# Patient Record
Sex: Female | Born: 1955 | Race: White | Hispanic: No | Smoking: Current every day smoker
Health system: Southern US, Community
[De-identification: ages and names within clinical notes are randomized; demographics above are authoritative.]

## PROBLEM LIST (undated history)

## (undated) ENCOUNTER — Emergency Department (HOSPITAL_COMMUNITY): Admission: EM | Payer: Medicare Other | Source: Home / Self Care

## (undated) DIAGNOSIS — F200 Paranoid schizophrenia: Secondary | ICD-10-CM

## (undated) DIAGNOSIS — E079 Disorder of thyroid, unspecified: Secondary | ICD-10-CM

## (undated) HISTORY — PX: ABDOMINAL HYSTERECTOMY: SHX81

---

## 2012-10-19 ENCOUNTER — Encounter (HOSPITAL_COMMUNITY): Payer: Self-pay | Admitting: Emergency Medicine

## 2012-10-19 ENCOUNTER — Emergency Department (HOSPITAL_COMMUNITY)
Admission: EM | Admit: 2012-10-19 | Discharge: 2012-10-21 | Disposition: A | Discharge status: Other Acute Inpatient Hospital | Payer: Medicare Other | Attending: Emergency Medicine | Admitting: Emergency Medicine

## 2012-10-19 DIAGNOSIS — Z91199 Patient's noncompliance with other medical treatment and regimen due to unspecified reason: Secondary | ICD-10-CM | POA: Insufficient documentation

## 2012-10-19 DIAGNOSIS — F200 Paranoid schizophrenia: Secondary | ICD-10-CM | POA: Insufficient documentation

## 2012-10-19 DIAGNOSIS — E079 Disorder of thyroid, unspecified: Secondary | ICD-10-CM | POA: Insufficient documentation

## 2012-10-19 DIAGNOSIS — Z9119 Patient's noncompliance with other medical treatment and regimen: Secondary | ICD-10-CM | POA: Insufficient documentation

## 2012-10-19 DIAGNOSIS — R45851 Suicidal ideations: Secondary | ICD-10-CM | POA: Insufficient documentation

## 2012-10-19 HISTORY — DX: Disorder of thyroid, unspecified: E07.9

## 2012-10-19 HISTORY — DX: Paranoid schizophrenia: F20.0

## 2012-10-19 LAB — COMPREHENSIVE METABOLIC PANEL
ALT: 13 U/L (ref 0–35)
AST: 17 U/L (ref 0–37)
CO2: 24 mEq/L (ref 19–32)
Calcium: 9.1 mg/dL (ref 8.4–10.5)
GFR calc non Af Amer: >90 mL/min (ref >90)
Sodium: 137 mEq/L (ref 135–145)
Total Protein: 6.8 g/dL (ref 6.0–8.3)

## 2012-10-19 LAB — RAPID URINE DRUG SCREEN, HOSP PERFORMED
Amphetamines: NOT DETECTED
Barbiturates: NOT DETECTED
Benzodiazepines: NOT DETECTED
Cocaine: NOT DETECTED
Tetrahydrocannabinol: NOT DETECTED

## 2012-10-19 LAB — CBC
MCH: 30.3 pg (ref 26.0–34.0)
Platelets: 337 10*3/uL (ref 150–400)
RBC: 4.19 MIL/uL (ref 3.87–5.11)
WBC: 7 10*3/uL (ref 4.0–10.5)

## 2012-10-19 MED ORDER — ACETAMINOPHEN 325 MG PO TABS
650.0000 mg | ORAL_TABLET | ORAL | Status: DC | PRN
  Administered 2012-10-20: 650 mg via ORAL
  Filled 2012-10-19: qty 2

## 2012-10-19 MED ORDER — LORAZEPAM 1 MG PO TABS: 1.0000 mg | ORAL_TABLET | Freq: Three times a day (TID) | ORAL | Status: DC | PRN

## 2012-10-19 NOTE — ED Notes (Signed)
Pt states that she is having suicidal thoughts to hang herself.  States she has hung herself before but the rope broke.  Also states that she has cut wrists before.  Pt is overly pleasant in triage, smiling and laughing when talking about suicide.

## 2012-10-19 NOTE — BH Assessment (Signed)
Assessment Note   Dawn Fletcher is an 56 y.o. female who presents to [facility] voluntarily. She states she considered hanging herself today. She endorses SI and states that "someone is stalking me". Pt says she knows someone is stalking her as "my hair has been cut and my coffee will just half disappear". She also states someone broke heel of her shoe. Pt sts she has dx of schizophrenia. She describes mood as "angry, upset, harassed". Her affect doesn't match thought content and affect is euthymic. She says she doesn't know who is stalking her. She also says, "I smelled plastic burning yesterday and I thought it was coming from my skin".  She says she has been inpatient at [facility] in [place]. Pt endorses erratic sleep patterns and says that she has lost 7 lbs b/c she forgets to eat. Thought process is tangential and she displays flight of ideas. Pt denies HI. She denies [facility]. She does say that she sometimes has "blurred vision" but attributes that to her "carpal tunnel". Pt needs inpatient treatment. Dr. Solon Palm telepsych consult rec inpatient also. 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 Thyroid disease   . Paranoid schizophrenia    Axis IV: other psychosocial or environmental problems, problems related to social environment and problems with primary support group Axis V: 31-40 impairment in reality testing  Past Medical History:  Past Medical History  Diagnosis Date  . Thyroid disease   . Paranoid schizophrenia     Past Surgical History  Procedure Date  . Abdominal hysterectomy     Family History: History reviewed. No pertinent family history.  Social History:  reports that she has never smoked. She does not have any smokeless tobacco history on file. She reports that she does not drink alcohol or use illicit  drugs.  Additional Social History:  Alcohol / Drug Use Pain Medications: none Prescriptions: none listed on PTA list Over the Counter: see PTA meds list History of alcohol / drug use?: No history of alcohol / drug abuse  CIWA: CIWA-Ar BP: 102/65 mmHg Pulse Rate: 57  COWS:    Allergies: Not on File  Home Medications:  (Not in a hospital admission)  OB/GYN Status:  No LMP recorded. Patient has had a hysterectomy.  General Assessment Data [facility]: [facility] Living Arrangements: Alone Can pt return to current living arrangement?: Yes Admission Status: Voluntary Is patient capable of signing voluntary admission?: Yes Transfer from: Acute Hospital Referral Source: Self/Family/Friend  Education Status Is patient currently in school?: No Current Grade: na Highest grade of school patient has completed: 31 Name of school: some college  Risk to self Suicidal Ideation: Yes-Currently Present Suicidal Intent: No Is patient at risk for suicide?: Yes Suicidal Plan?: No (pt denies intent but states she considered hanging herself) Access to Means: Yes Specify Access to Suicidal Means: rope What has been your use of drugs/alcohol within the last 12 months?: none Previous Attempts/Gestures: Yes How many times?:  ("a few times") Other Self Harm Risks: pt denies  Triggers for Past Attempts: Unknown Intentional Self Injurious Behavior: None Family Suicide History: No (family hx of schizophrenia) Recent stressful life event(s): Financial Problems (work stress, cost of living) Persecutory voices/beliefs?: Yes Depression: No Depression Symptoms: Feeling angry/irritable Substance abuse history and/or treatment for substance abuse?: No Suicide prevention information given  to non-admitted patients: Not applicable  Risk to Others Homicidal Ideation: No Thoughts of Harm to Others: No Current Homicidal Intent: No Current Homicidal Plan: No Access to Homicidal Means:  No Identified Victim: none History of harm to others?: No Assessment of Violence: None Noted Violent Behavior Description: pt calm during assessment Does patient have access to weapons?: No Criminal Charges Pending?: No Does patient have a court date: No  Psychosis Hallucinations: None noted Delusions: Somatic;Persecutory (someone stalking pt; her skin smelled like plastic burning)  Mental Status Report Appear/Hygiene: Other (Comment) (unremarkable) Eye Contact: Good Motor Activity: Freedom of movement;Restlessness Speech: Tangential;Logical/coherent Level of Consciousness: Alert Mood: Angry;Fearful;Suspicious Affect: Inconsistent with thought content;Other (Comment) (euthymic) Anxiety Level: Moderate Thought Processes: Flight of Ideas;Tangential;Circumstantial;Irrelevant;Coherent;Relevant Judgement: Impaired Orientation: Person;Place;Time;Situation Obsessive Compulsive Thoughts/Behaviors: None  Cognitive Functioning Concentration: Normal Memory: Remote Intact;Recent Intact IQ: Average Insight: Poor Impulse Control: Fair Appetite: Poor Weight Loss: 7  Weight Gain: 0  Sleep: No Change Total Hours of Sleep:  (erratic - sleeps 2 or 7 or no hrs at all depending on night) Vegetative Symptoms: None  ADLScreening [facility] Assessment Services) Patient's cognitive ability adequate to safely complete daily activities?: Yes Patient able to express need for assistance with ADLs?: Yes Independently performs ADLs?: Yes (appropriate for developmental age)  Abuse/Neglect [facility] Physical Abuse: Denies Verbal Abuse: Yes, past (Comment) (no info provided) Sexual Abuse: Denies  Prior Inpatient Therapy Prior Inpatient Therapy: Yes Prior Therapy Dates: pt doesn't remember dates Prior Therapy Facilty/Provider(s): [address], [place] in [place] Reason for Treatment: schizophrenia  Prior Outpatient Therapy Prior Outpatient Therapy: Yes Prior Therapy Dates: pt doesn't know  dates Prior Therapy Facilty/Provider(s): pt doesn't know names of provider(s) Reason for Treatment: schizophrenia  ADL Screening (condition at time of admission) Patient's cognitive ability adequate to safely complete daily activities?: Yes Patient able to express need for assistance with ADLs?: Yes Independently performs ADLs?: Yes (appropriate for developmental age) Weakness of Legs: None Weakness of Arms/Hands: None  Home Assistive Devices/Equipment Home Assistive Devices/Equipment: None    Abuse/Neglect Assessment (Assessment to be complete while patient is alone) Physical Abuse: Denies Verbal Abuse: Yes, past (Comment) (no info provided) Sexual Abuse: Denies Exploitation of patient/patient's resources: Denies Self-Neglect: Denies Values / Beliefs Cultural Requests During Hospitalization: None Spiritual Requests During Hospitalization: None   Advance Directives (For Healthcare) Advance Directive: Patient does not have advance directive    Additional Information 1:1 In Past 12 Months?:  (unknown) CIRT Risk: No Elopement Risk: No Does patient have medical clearance?: Yes     Disposition:  Disposition Disposition of Patient: Inpatient treatment program ([facility] telepysch recommends inpt and writer in agreemtn) Type of inpatient treatment program: Adult  On Site Evaluation by:   Reviewed with Physician:     Donnamarie Rossetti P 10/19/2012 8:13 PM

## 2012-10-19 NOTE — ED Notes (Signed)
Received pt in rm 40, pt laying comfortably in bed, denies any needs at his time. Pt sts she is here for "disturbed thoughts"; pt sts she feels that someone is stalking her and "messing with me. They cut a chunk of my hair, and the heel was broken on my new shoes" Pt then sts "I have paranoid schizophrenia since I was 56 years old, and sometimes my medications need to be adjusted."

## 2012-10-19 NOTE — ED Notes (Signed)
Pt has been scrubbed/wanded.  1 pt belongings bag, 1 polka dot bookbag, one red purse at triage nurses station.

## 2012-10-19 NOTE — ED Notes (Signed)
Pt is on a rant about how she thinks someone is stalking her and cutting her hair, messing with her coffee, hair care products, purse, lipstick, etc.  States that she is suicidal because she thinks someone is going to abduct her.

## 2012-10-19 NOTE — ED Provider Notes (Signed)
History     CSN: [id number]  Arrival date & time 10/19/12  4098   First MD Initiated Contact with Patient 10/19/12 [phone]      Chief Complaint  Patient presents with  . Medical Clearance  . Suicidal    (Consider location/radiation/quality/duration/timing/severity/associated sxs/prior treatment) The history is provided by the patient.  pt with hx schizophrenia. States is not taking her meds, feels others are stalking her, which causes her to feel she wants to kill herself. Feeling worse gradually over a period of a couple weeks. Denies attempt, but states has thoughts of hanging self.  Denies drug or alcohol abuse. States physical health at baseline. Denies headaches, no fever or chills. States eating and drinking normally.       Past Medical History  Diagnosis Date  . Thyroid disease   . Paranoid schizophrenia     Past Surgical History  Procedure Date  . Abdominal hysterectomy     History reviewed. No pertinent family history.  History  Substance Use Topics  . Smoking status: Never Smoker   . Smokeless tobacco: Not on file  . Alcohol Use: No    OB History    Grav Para Term Preterm Abortions TAB SAB Ect Mult Living                  Review of Systems  Constitutional: Negative for fever and chills.  HENT: Negative for neck pain.   Eyes: Negative for redness.  Respiratory: Negative for shortness of breath.   Cardiovascular: Negative for chest pain.  Gastrointestinal: Negative for abdominal pain.  Genitourinary: Negative for flank pain.  Musculoskeletal: Negative for back pain.  Skin: Negative for rash.  Neurological: Negative for headaches.  Hematological: Does not bruise/bleed easily.  Psychiatric/Behavioral: Positive for dysphoric mood.    Allergies  Review of patient's allergies indicates not on file.  Home Medications  No current outpatient prescriptions on file.  BP 145/83  Pulse 67  Temp 98 F (36.7 C) (Oral)  Resp 18  SpO2 98%  Physical  Exam  Nursing note and vitals reviewed. Constitutional: She appears well-developed and well-nourished. No distress.  HENT:  Head: Atraumatic.  Mouth/Throat: Oropharynx is clear and moist.  Eyes: Conjunctivae normal are normal. Pupils are equal, round, and reactive to light. No scleral icterus.  Neck: Neck supple. No tracheal deviation present.  Cardiovascular: Normal rate.   Pulmonary/Chest: Effort normal. No respiratory distress.  Abdominal: Soft. Normal appearance. There is no tenderness.  Musculoskeletal: She exhibits no edema.  Neurological: She is alert.       Motor intact bil. Ambulates w steady gait.   Skin: Skin is warm and dry. No rash noted.  Psychiatric:       Pt w paranoid thoughts. Moves from one unconnected thought process to next. +suicidal thoughts.     ED Course  Procedures (including critical care time)   Labs Reviewed  CBC  COMPREHENSIVE METABOLIC PANEL  ETHANOL  URINE RAPID DRUG SCREEN (HOSP PERFORMED)    Results for orders placed during the hospital encounter of 10/19/12  CBC      Component Value Range   WBC 7.0  4.0 - 10.5 K/uL   RBC 4.19  3.87 - 5.11 MIL/uL   Hemoglobin 12.7  12.0 - 15.0 g/dL   HCT 47.8  29.5 - 62.1 %   MCV 89.5  78.0 - 100.0 fL   MCH 30.3  26.0 - 34.0 pg   MCHC 33.9  30.0 - 36.0 g/dL   RDW  14.6  11.5 - 15.5 %   Platelets 337  150 - 400 K/uL  COMPREHENSIVE METABOLIC PANEL      Component Value Range   Sodium 137  135 - 145 mEq/L   Potassium 4.0  3.5 - 5.1 mEq/L   Chloride 104  96 - 112 mEq/L   CO2 24  19 - 32 mEq/L   Glucose, Bld 118 (*) 70 - 99 mg/dL   BUN 10  6 - 23 mg/dL   Creatinine, Ser 1.61  0.50 - 1.10 mg/dL   Calcium 9.1  8.4 - 09.6 mg/dL   Total Protein 6.8  6.0 - 8.3 g/dL   Albumin 3.5  3.5 - 5.2 g/dL   AST 17  0 - 37 U/L   ALT 13  0 - 35 U/L   Alkaline Phosphatase 97  39 - 117 U/L   Total Bilirubin 0.1 (*) 0.3 - 1.2 mg/dL   GFR calc non Af Amer >90  >90 mL/min   GFR calc Af Amer >90  >90 mL/min  ETHANOL       Component Value Range   Alcohol, Ethyl (B) <11  0 - 11 mg/dL  URINE RAPID DRUG SCREEN (HOSP PERFORMED)      Component Value Range   Opiates NONE DETECTED  NONE DETECTED   Cocaine NONE DETECTED  NONE DETECTED   Benzodiazepines NONE DETECTED  NONE DETECTED   Amphetamines NONE DETECTED  NONE DETECTED   Tetrahydrocannabinol NONE DETECTED  NONE DETECTED   Barbiturates NONE DETECTED  NONE DETECTED       MDM  Labs. Act team and telepsych consult.  Reviewed nursing notes and prior charts for additional history.   Recheck alert, content, awaiting telepsych.         Suzi Roots, MD 10/19/12 [phone]

## 2012-10-19 NOTE — ED Notes (Signed)
Pt belongings moved with pt and placed in locker number 40 outside psych ed

## 2012-10-19 NOTE — ED Notes (Addendum)
Pt belongings:  1 grey scarf, pair of black gloves, pink hat, black hat, blue/black jacket, black jacket, blue wind breaker, red purse, yellow purse, sun glasses, tobacco, misc. clothes, purfume, black pants,  1 polka dot back pack

## 2012-10-20 NOTE — BH Assessment (Signed)
Assessment Note   Dawn Fletcher is an 56 y.o. female that is being reassessed tonight regarding her need for inpatient treatment.  Pt now reports feeling "much better; more rested; and I understand that it was not rational to feel that I am being stalked."  Pt admits hx of Schizophrenia with previous hospitalizations (last one 3 years ago per report) but states that she has been non-compliant with her medication and feels that "when taken correctly, I can work and live alone okay."  Since being given her medications in the ED, pt appears more relaxed and not manic or tangential in speech.  Pt is oriented x3 and is able to understand why she remains in the ED.  Pt no longer voices feeling that she is a threat to herself "I'm not going to do anything."  Pt voices that she is able to contact her current providers if need for enhanced care arises.  Spoke with MD, who agrees to re-evaluate pt and her need for inpatient care.  Awaiting disposition by MD and nursing care staff regarding her disposition.   Dawn Fletcher is an 56 y.o. female who presents to [facility] voluntarily. She states she considered hanging herself today. She endorses SI and states that "someone is stalking me". Pt says she knows someone is stalking her as "my hair has been cut and my coffee will just half disappear". She also states someone broke heel of her shoe. Pt sts she has dx of schizophrenia. She describes mood as "angry, upset, harassed". Her affect doesn't match thought content and affect is euthymic. She says she doesn't know who is stalking her. She also says, "I smelled plastic burning yesterday and I thought it was coming from my skin". She says she has been inpatient at [facility] in [place]. Pt endorses erratic sleep patterns and says that she has lost 7 lbs b/c she forgets to eat. Thought process is tangential and she displays flight of ideas. Pt denies HI. She denies [facility]. She does say that she sometimes has  "blurred vision" but attributes that to her "carpal tunnel". Pt needs inpatient treatment. Dr. Solon Palm telepsych consult rec inpatient also. 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    Axis I: Schizophrenia, Undifferentiated Type Axis II: Deferred Axis III:  Past Medical History  Diagnosis Date  . Thyroid disease   . Paranoid schizophrenia    Axis IV: economic problems and other psychosocial or environmental problems Axis V: 31-40 impairment in reality testing  Past Medical History:  Past Medical History  Diagnosis Date  . Thyroid disease   . Paranoid schizophrenia     Past Surgical History  Procedure Date  . Abdominal hysterectomy     Family History: History reviewed. No pertinent family history.  Social History:  reports that she has never smoked. She does not have any smokeless tobacco history on file. She reports that she does not drink alcohol or use illicit drugs.  Additional Social History:  Alcohol / Drug Use Pain Medications: none Prescriptions: none listed on PTA list Over the Counter: see PTA meds list History of alcohol / drug use?: No history of alcohol / drug abuse  CIWA: CIWA-Ar BP: 117/76 mmHg Pulse Rate: 57  COWS:    Allergies: Not on File  Home Medications:  (Not in a hospital admission)  OB/GYN Status:  No LMP recorded. Patient has had a hysterectomy.  General Assessment Data [facility] Living  Arrangements: Alone Can pt return to current living arrangement?: Yes Admission Status: Voluntary Is patient capable of signing voluntary admission?: Yes Transfer from: Acute Hospital Referral Source: Self/Family/Friend  Education Status Is patient currently in school?: No Current Grade: na Highest grade of school patient has completed: 61 Name of school: some college  Risk to self Suicidal Ideation: No Suicidal Intent: No Is patient at risk for suicide?: Yes Suicidal Plan?: No Access to  Means: Yes Specify Access to Suicidal Means: rope and sharps available when not in ER What has been your use of drugs/alcohol within the last 12 months?: none per pt Previous Attempts/Gestures: Yes How many times?:  (unknown) Other Self Harm Risks: pt denies Triggers for Past Attempts: Unpredictable Intentional Self Injurious Behavior: None Family Suicide History: No Recent stressful life event(s): Turmoil (Comment) (trouble with financial obligations) Persecutory voices/beliefs?: Yes Depression: Yes Depression Symptoms: Guilt Substance abuse history and/or treatment for substance abuse?: No Suicide prevention information given to non-admitted patients: Not applicable  Risk to Others Homicidal Ideation: No Thoughts of Harm to Others: No Current Homicidal Intent: No Current Homicidal Plan: No Access to Homicidal Means: No Identified Victim: none per pt History of harm to others?: No Assessment of Violence: None Noted Violent Behavior Description: pt is resting quietly; able to answer appropriately Does patient have access to weapons?: No Criminal Charges Pending?: No Does patient have a court date: No  Psychosis Hallucinations: None noted Delusions: Persecutory (now admits that her delusions were unrealistic)  Mental Status Report Appear/Hygiene:  (casual in scrubs) Eye Contact: Good Motor Activity: Unremarkable Speech: Logical/coherent;Soft Level of Consciousness: Quiet/awake Mood: Labile Affect: Euphoric Anxiety Level: Minimal Thought Processes: Relevant Judgement: Impaired Orientation: Person;Place;Time;Situation Obsessive Compulsive Thoughts/Behaviors: Moderate  Cognitive Functioning Concentration: Decreased Memory: Remote Impaired;Recent Intact IQ: Average Insight: Fair Impulse Control: Fair Appetite: Fair Weight Loss:  (7 lbs in last several weeks per report; "just haven't had ap) Weight Gain: 0  Sleep: No Change Total Hours of Sleep:  (vascillates;  feels more rested now) Vegetative Symptoms: None  ADLScreening [facility] Assessment Services) Patient's cognitive ability adequate to safely complete daily activities?: Yes Patient able to express need for assistance with ADLs?: Yes Independently performs ADLs?: Yes (appropriate for developmental age)  Abuse/Neglect [facility]) Physical Abuse: Denies Verbal Abuse: Yes, past (Comment) Sexual Abuse: Denies  Prior Inpatient Therapy Prior Inpatient Therapy: Yes Prior Therapy Dates: pt doesn't remember dates Prior Therapy Facilty/Provider(s): [address], [place] in [place] Reason for Treatment: schizophrenia  Prior Outpatient Therapy Prior Outpatient Therapy: Yes Prior Therapy Dates: pt doesn't know dates Prior Therapy Facilty/Provider(s): pt doesn't know names of provider(s) Reason for Treatment: schizophrenia  ADL Screening (condition at time of admission) Patient's cognitive ability adequate to safely complete daily activities?: Yes Patient able to express need for assistance with ADLs?: Yes Independently performs ADLs?: Yes (appropriate for developmental age) Weakness of Legs: None Weakness of Arms/Hands: None  Home Assistive Devices/Equipment Home Assistive Devices/Equipment: None    Abuse/Neglect Assessment (Assessment to be complete while patient is alone) Physical Abuse: Denies Verbal Abuse: Yes, past (Comment) Sexual Abuse: Denies Exploitation of patient/patient's resources: Denies Self-Neglect: Denies Values / Beliefs Cultural Requests During Hospitalization: None Spiritual Requests During Hospitalization: None   Advance Directives (For Healthcare) Advance Directive: Patient does not have advance directive    Additional Information 1:1 In Past 12 Months?: No CIRT Risk: No Elopement Risk: No Does patient have medical clearance?: Yes     Disposition:  Review at [facility] versus discharge with outpatient f/u care.  Awaiting MD  determination. Disposition Disposition of Patient: Referred to Type of inpatient treatment program: Adult Patient referred to: Other (Comment) (may be d/c with referrals to current provider if MD approves)  On Site Evaluation by:   Reviewed with Physician:     Angelica Ran 10/20/2012 9:42 PM

## 2012-10-20 NOTE — ED Notes (Signed)
D: Patient pleasant but appearing superficially bright with fixed smile. Pt denies SI or plans to harm herself. A: Q 15 minute safety checks maintained per protocol. Medications given as ordered by MD. R: Patient with no s/s of distress or inappropriate behaviors.

## 2012-10-20 NOTE — ED Provider Notes (Signed)
No events overnight, awaiting placement. Telepsych recommends inpatient.   Dawn B. Bernette Mayers, MD 10/20/12 [phone]

## 2012-10-20 NOTE — Progress Notes (Signed)
Pt stated to [facility] and nurse she was better and wanted to go home.  Tele Psych may be reordered.  ACT recommendation is to wait until psychiatrist makes rounds on Monday AM.    Pt was just assessed by ACT at approximately 2100 hours on 10-19-12 with a plan to hang self and has hx of schizophrenia.  See assessment for details.  Tele Psych occurred after ACT assessment.    No need for a new ACT assessment at this time.

## 2012-10-21 MED ORDER — RISPERIDONE 2 MG PO TABS: 2.0000 mg | ORAL_TABLET | Freq: Every day | ORAL | Status: DC

## 2012-10-21 MED ORDER — RISPERIDONE 1 MG PO TABS
1.0000 mg | ORAL_TABLET | ORAL | Status: DC
  Administered 2012-10-21: 1 mg via ORAL
  Filled 2012-10-21: qty 1

## 2012-10-21 NOTE — ED Provider Notes (Addendum)
Dawn Fletcher is a 56 y.o. female here with paranoia. Feels well this AM. Telepsych saw her last night and still recommend inpatient psych. Patient calm this AM. Pending placement.    Richardean Canal, MD 10/21/12 [phone]  10:04 AM Accepted at [facility] under Dr. Robet Leu, stable for transfer.   Richardean Canal, MD 10/21/12 1004

## 2012-10-21 NOTE — ED Notes (Signed)
Sheriff here to transport pt to [facility]

## 2012-12-04 ENCOUNTER — Emergency Department (HOSPITAL_COMMUNITY)
Admission: EM | Admit: 2012-12-04 | Discharge: 2012-12-06 | Disposition: A | Discharge status: Home / Self Care | Payer: Medicare Other | Attending: Emergency Medicine | Admitting: Emergency Medicine

## 2012-12-04 ENCOUNTER — Encounter (HOSPITAL_COMMUNITY): Payer: Self-pay | Admitting: Emergency Medicine

## 2012-12-04 DIAGNOSIS — Z79899 Other long term (current) drug therapy: Secondary | ICD-10-CM | POA: Insufficient documentation

## 2012-12-04 DIAGNOSIS — R059 Cough, unspecified: Secondary | ICD-10-CM | POA: Insufficient documentation

## 2012-12-04 DIAGNOSIS — J3489 Other specified disorders of nose and nasal sinuses: Secondary | ICD-10-CM | POA: Insufficient documentation

## 2012-12-04 DIAGNOSIS — F200 Paranoid schizophrenia: Secondary | ICD-10-CM

## 2012-12-04 DIAGNOSIS — R45851 Suicidal ideations: Secondary | ICD-10-CM | POA: Insufficient documentation

## 2012-12-04 DIAGNOSIS — Z862 Personal history of diseases of the blood and blood-forming organs and certain disorders involving the immune mechanism: Secondary | ICD-10-CM | POA: Insufficient documentation

## 2012-12-04 DIAGNOSIS — R05 Cough: Secondary | ICD-10-CM | POA: Insufficient documentation

## 2012-12-04 DIAGNOSIS — Z8639 Personal history of other endocrine, nutritional and metabolic disease: Secondary | ICD-10-CM | POA: Insufficient documentation

## 2012-12-04 LAB — COMPREHENSIVE METABOLIC PANEL
AST: 18 U/L (ref 0–37)
BUN: 12 mg/dL (ref 6–23)
CO2: 27 mEq/L (ref 19–32)
Chloride: 101 mEq/L (ref 96–112)
Creatinine, Ser: 0.7 mg/dL (ref 0.50–1.10)
GFR calc Af Amer: >90 mL/min (ref >90)
GFR calc non Af Amer: >90 mL/min (ref >90)
Glucose, Bld: 99 mg/dL (ref 70–99)
Total Bilirubin: 0.2 mg/dL — ABNORMAL LOW (ref 0.3–1.2)

## 2012-12-04 LAB — RAPID URINE DRUG SCREEN, HOSP PERFORMED
Barbiturates: NOT DETECTED
Cocaine: NOT DETECTED
Opiates: NOT DETECTED

## 2012-12-04 LAB — CBC WITH DIFFERENTIAL/PLATELET
HCT: 39.1 % (ref 36.0–46.0)
Hemoglobin: 13 g/dL (ref 12.0–15.0)
Lymphocytes Relative: 33 % (ref 12–46)
Lymphs Abs: 3.3 10*3/uL (ref 0.7–4.0)
MCV: 91.1 fL (ref 78.0–100.0)
Monocytes Absolute: 0.6 10*3/uL (ref 0.1–1.0)
Monocytes Relative: 6 % (ref 3–12)
Neutro Abs: 6 10*3/uL (ref 1.7–7.7)
WBC: 10 10*3/uL (ref 4.0–10.5)

## 2012-12-04 NOTE — ED Provider Notes (Signed)
Medical screening examination/treatment/procedure(s) were performed by non-physician practitioner and as supervising physician I was immediately available for consultation/collaboration.   Benny Lennert, MD 12/04/12 2241

## 2012-12-04 NOTE — ED Provider Notes (Signed)
History     CSN: [id number]  Arrival date & time 12/04/12  1854   First MD Initiated Contact with Patient 12/04/12 1920      Chief Complaint  Patient presents with  . V70.1    (Consider location/radiation/quality/duration/timing/severity/associated sxs/prior treatment) HPI Comments: This is a 57 year old female, who presents emergency department with chief complaint of suicidal ideation. Patient states that earlier this morning, she was having coffee at [facility], when she lost some money. She says that this caused her to become distraught. She became even more angry when the snow started. She states that she thought that spring was coming and when she saw the snow decided to walk out in the middle of traffic because life wasn't worth living. And she states that she has had previous attempts at suicide.  She denies being in any pain at this time.  She has been seen here previously for the same.  She endorse some sinus and chest congestion associated with cough.  Denies shortness of breath, chest pain, nausea, and vomiting.  The history is provided by the patient. No language interpreter was used.    Past Medical History  Diagnosis Date  . Thyroid disease   . Paranoid schizophrenia     Past Surgical History  Procedure Laterality Date  . Abdominal hysterectomy      History reviewed. No pertinent family history.  History  Substance Use Topics  . Smoking status: Never Smoker   . Smokeless tobacco: Not on file  . Alcohol Use: No    OB History   Grav Para Term Preterm Abortions TAB SAB Ect Mult Living                  Review of Systems  All other systems reviewed and are negative.    Allergies  Review of patient's allergies indicates no known allergies.  Home Medications   Current Outpatient Rx  Name  Route  Sig  Dispense  Refill  . Ascorbic Acid (VITAMIN C PO)   Oral   Take 1 tablet by mouth daily.          . Omega-3 Fatty Acids (FISH OIL PO)   Oral   Take 1  capsule by mouth daily.          Marland Kitchen VITAMIN E PO   Oral   Take 1 tablet by mouth daily.            BP 145/81  Pulse 68  Temp(Src) 98.4 F (36.9 C) (Oral)  SpO2 97%  Physical Exam  Nursing note and vitals reviewed. Constitutional: She is oriented to person, place, and time. She appears well-developed and well-nourished.  HENT:  Head: Normocephalic and atraumatic.  Eyes: Conjunctivae and EOM are normal. Pupils are equal, round, and reactive to light.  Neck: Normal range of motion. Neck supple.  Cardiovascular: Normal rate and regular rhythm.  Exam reveals no gallop and no friction rub.   No murmur heard. Pulmonary/Chest: Effort normal and breath sounds normal. No respiratory distress. She has no wheezes. She has no rales. She exhibits no tenderness.  Abdominal: Soft. Bowel sounds are normal. She exhibits no distension and no mass. There is no tenderness. There is no rebound and no guarding.  Musculoskeletal: Normal range of motion. She exhibits no edema and no tenderness.  Neurological: She is alert and oriented to person, place, and time.  Skin: Skin is warm and dry.  Psychiatric:  Over animated/dramatic, pleasant to talk to, but overly concerned  about the snow and weather,     ED Course  Procedures (including critical care time)  Labs Reviewed  CBC WITH DIFFERENTIAL  COMPREHENSIVE METABOLIC PANEL  URINE RAPID DRUG SCREEN (HOSP PERFORMED)  ETHANOL   Results for orders placed during the hospital encounter of 12/04/12  CBC WITH DIFFERENTIAL      Result Value Range   WBC 10.0  4.0 - 10.5 K/uL   RBC 4.29  3.87 - 5.11 MIL/uL   Hemoglobin 13.0  12.0 - 15.0 g/dL   HCT 21.3  08.6 - 57.8 %   MCV 91.1  78.0 - 100.0 fL   MCH 30.3  26.0 - 34.0 pg   MCHC 33.2  30.0 - 36.0 g/dL   RDW 46.9  62.9 - 52.8 %   Platelets 331  150 - 400 K/uL   Neutrophils Relative 60  43 - 77 %   Neutro Abs 6.0  1.7 - 7.7 K/uL   Lymphocytes Relative 33  12 - 46 %   Lymphs Abs 3.3  0.7 - 4.0 K/uL    Monocytes Relative 6  3 - 12 %   Monocytes Absolute 0.6  0.1 - 1.0 K/uL   Eosinophils Relative 1  0 - 5 %   Eosinophils Absolute 0.1  0.0 - 0.7 K/uL   Basophils Relative 0  0 - 1 %   Basophils Absolute 0.0  0.0 - 0.1 K/uL  COMPREHENSIVE METABOLIC PANEL      Result Value Range   Sodium 139  135 - 145 mEq/L   Potassium 4.7  3.5 - 5.1 mEq/L   Chloride 101  96 - 112 mEq/L   CO2 27  19 - 32 mEq/L   Glucose, Bld 99  70 - 99 mg/dL   BUN 12  6 - 23 mg/dL   Creatinine, Ser 4.13  0.50 - 1.10 mg/dL   Calcium 9.1  8.4 - 24.4 mg/dL   Total Protein 7.4  6.0 - 8.3 g/dL   Albumin 3.9  3.5 - 5.2 g/dL   AST 18  0 - 37 U/L   ALT 13  0 - 35 U/L   Alkaline Phosphatase 100  39 - 117 U/L   Total Bilirubin 0.2 (*) 0.3 - 1.2 mg/dL   GFR calc non Af Amer >90  >90 mL/min   GFR calc Af Amer >90  >90 mL/min  URINE RAPID DRUG SCREEN (HOSP PERFORMED)      Result Value Range   Opiates NONE DETECTED  NONE DETECTED   Cocaine NONE DETECTED  NONE DETECTED   Benzodiazepines NONE DETECTED  NONE DETECTED   Amphetamines NONE DETECTED  NONE DETECTED   Tetrahydrocannabinol NONE DETECTED  NONE DETECTED   Barbiturates NONE DETECTED  NONE DETECTED  ETHANOL      Result Value Range   Alcohol, Ethyl (B) <11  0 - 11 mg/dL   No results found.    No diagnosis found.    MDM  57 year old female with SI.  I am going to move the patient to psych ED.  Psych hold orders have been placed.  ACT team has been consulted.  Labs have been ordered.  Medically clear thus far, will move to psych ED as no sitters are available because of inclement weather.  Will continue to monitor until labs are returned.        Roxy Horseman, PA-C 12/04/12 2109

## 2012-12-04 NOTE — ED Notes (Addendum)
Pt called police to pick her up after she was at [facility] and lost her money and started thinking about her problems. States she purposefully stepped into traffic in an attempt to hurt herself and decided that she should come in for evaluation. Pt has somewhat of a flight of ideas in triage. States that she has been taking her levothyroxin and risperdol. Pleasant affect.

## 2012-12-04 NOTE — ED Notes (Signed)
4 bags in locker 34

## 2012-12-05 MED ORDER — RISPERIDONE 2 MG PO TABS
2.0000 mg | ORAL_TABLET | Freq: Every day | ORAL | Status: DC
  Administered 2012-12-05: 2 mg via ORAL
  Filled 2012-12-05: qty 1

## 2012-12-05 NOTE — ED Provider Notes (Signed)
telepsych saw the pt and felt pt needed inpt psych admit.  Also start risperadal 2mg  po qhs  Gwyneth Sprout, MD 12/05/12 [phone]

## 2012-12-05 NOTE — BH Assessment (Signed)
Assessment Note   Dawn Fletcher is a 57 y.o. female who presents to wled with SI.  Pt brought in voluntarily by [employer], after she intentionally walked into traffic. Pt admits she was trying to harm self and states she doesn't feel safe at home--"It frightens me to go home".  Pt tells this Clinical research associate that she has tried to harm self 5x's in the past, by overdose, hanging and cutting self.  Pt says this episode was triggered by losing money at a [employer] and then states she became distraught when it started to snow because she thought spring was coming.  Pt says the heavy snowfall frightened her and made her feel paranoid. Pt says she decided life was not worth living.  Pt reports recent inpt admission with [facility] in 2013 and says she has been compliant with meds and outpatient visits with psych/therapist.  Pt is unable to contract for safety.     Axis I: Schizophrenia, Paranoid Type, 295.30 Axis II: Deferred Axis III:  Past Medical History  Diagnosis Date  . Thyroid disease   . Paranoid schizophrenia    Axis IV: other psychosocial or environmental problems, problems related to social environment and problems with primary support group Axis V: 21-30 behavior considerably influenced by delusions or hallucinations OR serious impairment in judgment, communication OR inability to function in almost all areas  Past Medical History:  Past Medical History  Diagnosis Date  . Thyroid disease   . Paranoid schizophrenia     Past Surgical History  Procedure Laterality Date  . Abdominal hysterectomy      Family History: History reviewed. No pertinent family history.  Social History:  reports that she has never smoked. She does not have any smokeless tobacco history on file. She reports that she does not drink alcohol or use illicit drugs.  Additional Social History:  Alcohol / Drug Use Pain Medications: See MAR  Prescriptions: See MAR  Over the Counter: See MAR  History of alcohol / drug  use?: No history of alcohol / drug abuse Longest period of sobriety (when/how long): None   CIWA: CIWA-Ar BP: 103/65 mmHg Pulse Rate: 64 COWS:    Allergies: No Known Allergies  Home Medications:  (Not in a hospital admission)  OB/GYN Status:  No LMP recorded. Patient has had a hysterectomy.  General Assessment Data Location of Assessment: [facility] Living Arrangements: Alone Can pt return to current living arrangement?: Yes Admission Status: Voluntary Is patient capable of signing voluntary admission?: Yes Transfer from: Acute Hospital Referral Source: MD  Education Status Is patient currently in school?: No Current Grade: None  Highest grade of school patient has completed: 15  Name of school: Some Youth worker person: None   Risk to self Suicidal Ideation: Yes-Currently Present Suicidal Intent: Yes-Currently Present Is patient at risk for suicide?: Yes Suicidal Plan?: Yes-Currently Present Specify Current Suicidal Plan: Pt intentionally walked into traffic  Access to Means: Yes Specify Access to Suicidal Means: Traffic  What has been your use of drugs/alcohol within the last 12 months?: Pt denies  Previous Attempts/Gestures: Yes How many times?: 5 Other Self Harm Risks: None  Triggers for Past Attempts: Unpredictable Intentional Self Injurious Behavior: None Family Suicide History: No Recent stressful life event(s): Other (Comment) (Lost money at [employer] today and bad weather) Persecutory voices/beliefs?: No Depression: Yes Depression Symptoms: Loss of interest in usual pleasures Substance abuse history and/or treatment for substance abuse?: No Suicide prevention information given to non-admitted patients: Not applicable  Risk to Others Homicidal Ideation: No Thoughts of Harm to Others: No Current Homicidal Intent: No Current Homicidal Plan: No Access to Homicidal Means: No Identified Victim: None  History of harm to others?: No Assessment of  Violence: None Noted Violent Behavior Description: None  Does patient have access to weapons?: No Criminal Charges Pending?: No Does patient have a court date: No  Psychosis Hallucinations: None noted Delusions: None noted  Mental Status Report Appear/Hygiene: Disheveled Eye Contact: Good Motor Activity: Unremarkable Speech: Logical/coherent;Soft;Slow Level of Consciousness: Alert Mood: Fearful;Sad Affect: Fearful;Sad Anxiety Level: Minimal Thought Processes: Coherent;Relevant Judgement: Impaired Orientation: Person;Place;Time;Situation Obsessive Compulsive Thoughts/Behaviors: None  Cognitive Functioning Concentration: Normal Memory: Recent Intact;Remote Intact IQ: Average Insight: Poor Impulse Control: Poor Appetite: Fair Weight Loss: 0 Weight Gain: 0 Sleep: Decreased Total Hours of Sleep: 5 Vegetative Symptoms: None  ADLScreening [facility] Assessment Services) Patient's cognitive ability adequate to safely complete daily activities?: Yes Patient able to express need for assistance with ADLs?: Yes Independently performs ADLs?: Yes (appropriate for developmental age)  Abuse/Neglect [facility]) Physical Abuse: Denies Verbal Abuse: Denies Sexual Abuse: Denies  Prior Inpatient Therapy Prior Inpatient Therapy: Yes Prior Therapy Dates: 2005,2007,2010,2013 Prior Therapy Facilty/Provider(s): [address], [place], [place], [facility] Reason for Treatment: Schizophrenia   Prior Outpatient Therapy Prior Outpatient Therapy: Yes Prior Therapy Dates: Current  Prior Therapy Facilty/Provider(s): [facility] Reason for Treatment: Med Mgt/Therapy   ADL Screening (condition at time of admission) Patient's cognitive ability adequate to safely complete daily activities?: Yes Patient able to express need for assistance with ADLs?: Yes Independently performs ADLs?: Yes (appropriate for developmental age) Weakness of Legs: None Weakness of Arms/Hands: None  Home Assistive  Devices/Equipment Home Assistive Devices/Equipment: None  Therapy Consults (therapy consults require a physician order) PT Evaluation Needed: No OT Evalulation Needed: No SLP Evaluation Needed: No Abuse/Neglect Assessment (Assessment to be complete while patient is alone) Physical Abuse: Denies Verbal Abuse: Denies Sexual Abuse: Denies Exploitation of patient/patient's resources: Denies Self-Neglect: Denies Values / Beliefs Cultural Requests During Hospitalization: None Spiritual Requests During Hospitalization: None Consults Spiritual Care Consult Needed: No Social Work Consult Needed: No Merchant navy officer (For Healthcare) Advance Directive: Patient does not have advance directive;Patient would not like information Nutrition Screen- [facility] Adult/[facility] Patient's home diet: Regular Have you recently lost weight without trying?: No Have you been eating poorly because of a decreased appetite?: No Malnutrition Screening Tool Score: 0  Additional Information 1:1 In Past 12 Months?: No CIRT Risk: No Elopement Risk: No Does patient have medical clearance?: Yes     Disposition:  Disposition Disposition of Patient: Inpatient treatment program;Referred to [facility]) Type of inpatient treatment program: Adult Patient referred to: Other (Comment) [facility])  On Site Evaluation by:   Reviewed with Physician:     Murrell Redden 12/05/2012 1:13 AM

## 2012-12-06 DIAGNOSIS — F200 Paranoid schizophrenia: Secondary | ICD-10-CM

## 2012-12-06 NOTE — Consult Note (Signed)
Reason for Consult: Paranoid schizophrenia presented with the suicidal ideation Referring Physician: Dr. Jacklynn Fletcher Dawn Fletcher is an 57 y.o. female.  HPI: Patient was seen and chart reviewed. Patient has history of chronic paranoid schizophrenia presented to the [facility] long emergency department while in early after she intentionally walked into the trough the. Patient stated that she was frightened to go home yesterday. Patient reportedly lost money in a [employer] became distraught. Patient stated she ingested well in the emergency department feels wonderful dismounting and denies recent onset ideation intention or plans she has no evidence of psychotic symptoms. Patient contract for safety and willing to followup with outpatient psychiatry services.  MSE: Patient stated mood is good affect was appropriate bright and full. Patient stated mood is wonderful. Patient has no suicidal / homicidal ideations intentions or plans she has no evidence of psychotic symptoms.  Past Medical History  Diagnosis Date  . Thyroid disease   . Paranoid schizophrenia     Past Surgical History  Procedure Laterality Date  . Abdominal hysterectomy      History reviewed. No pertinent family history.  Social History:  reports that she has never smoked. She does not have any smokeless tobacco history on file. She reports that she does not drink alcohol or use illicit drugs.  Allergies: No Known Allergies  Medications: I have reviewed the patient's current medications.  Results for orders placed during the hospital encounter of 12/04/12 (from the past 48 hour(s))  URINE RAPID DRUG SCREEN (HOSP PERFORMED)     Status: None   Collection Time    12/04/12  8:03 PM      Result Value Range   Opiates NONE DETECTED  NONE DETECTED   Cocaine NONE DETECTED  NONE DETECTED   Benzodiazepines NONE DETECTED  NONE DETECTED   Amphetamines NONE DETECTED  NONE DETECTED   Tetrahydrocannabinol NONE DETECTED  NONE DETECTED    Barbiturates NONE DETECTED  NONE DETECTED   Comment:            DRUG SCREEN FOR MEDICAL PURPOSES     ONLY.  IF CONFIRMATION IS NEEDED     FOR ANY PURPOSE, NOTIFY LAB     WITHIN 5 DAYS.                LOWEST DETECTABLE LIMITS     FOR URINE DRUG SCREEN     Drug Class       Cutoff (ng/mL)     Amphetamine      1000     Barbiturate      200     Benzodiazepine   200     Tricyclics       300     Opiates          300     Cocaine          300     THC              50  CBC WITH DIFFERENTIAL     Status: None   Collection Time    12/04/12  8:10 PM      Result Value Range   WBC 10.0  4.0 - 10.5 K/uL   RBC 4.29  3.87 - 5.11 MIL/uL   Hemoglobin 13.0  12.0 - 15.0 g/dL   HCT 04.5  40.9 - 81.1 %   MCV 91.1  78.0 - 100.0 fL   MCH 30.3  26.0 - 34.0 pg   MCHC 33.2  30.0 -  36.0 g/dL   RDW 45.4  09.8 - 11.9 %   Platelets 331  150 - 400 K/uL   Neutrophils Relative 60  43 - 77 %   Neutro Abs 6.0  1.7 - 7.7 K/uL   Lymphocytes Relative 33  12 - 46 %   Lymphs Abs 3.3  0.7 - 4.0 K/uL   Monocytes Relative 6  3 - 12 %   Monocytes Absolute 0.6  0.1 - 1.0 K/uL   Eosinophils Relative 1  0 - 5 %   Eosinophils Absolute 0.1  0.0 - 0.7 K/uL   Basophils Relative 0  0 - 1 %   Basophils Absolute 0.0  0.0 - 0.1 K/uL  COMPREHENSIVE METABOLIC PANEL     Status: Abnormal   Collection Time    12/04/12  8:10 PM      Result Value Range   Sodium 139  135 - 145 mEq/L   Potassium 4.7  3.5 - 5.1 mEq/L   Chloride 101  96 - 112 mEq/L   CO2 27  19 - 32 mEq/L   Glucose, Bld 99  70 - 99 mg/dL   BUN 12  6 - 23 mg/dL   Creatinine, Ser 1.47  0.50 - 1.10 mg/dL   Calcium 9.1  8.4 - 82.9 mg/dL   Total Protein 7.4  6.0 - 8.3 g/dL   Albumin 3.9  3.5 - 5.2 g/dL   AST 18  0 - 37 U/L   ALT 13  0 - 35 U/L   Alkaline Phosphatase 100  39 - 117 U/L   Total Bilirubin 0.2 (*) 0.3 - 1.2 mg/dL   GFR calc non Af Amer >90  >90 mL/min   GFR calc Af Amer >90  >90 mL/min   Comment:            The eGFR has been calculated     using  the CKD EPI equation.     This calculation has not been     validated in all clinical     situations.     eGFR's persistently     <90 mL/min signify     possible Chronic Kidney Disease.  ETHANOL     Status: None   Collection Time    12/04/12  8:10 PM      Result Value Range   Alcohol, Ethyl (B) <11  0 - 11 mg/dL   Comment:            LOWEST DETECTABLE LIMIT FOR     SERUM ALCOHOL IS 11 mg/dL     FOR MEDICAL PURPOSES ONLY    No results found.  Positive for anxiety paranoid schizophrenia and got distraught when loss her money and started seeing snowy. Blood pressure 113/75, pulse 57, temperature 97.7 F (36.5 C), temperature source Oral, resp. rate 18, SpO2 96.00%.   Assessment/Plan: Schizophrenia paranoid type chronic  Recommendations: Patient will be recommended to the outpatient psychiatric services at [facility]. Patient does not meet criteria for acute psychiatric hospitalization.  [name],JANARDHAHA R. 12/06/2012, 11:49 AM

## 2012-12-06 NOTE — [facility] Suicide Risk Assessment (Signed)
Suicide Risk Assessment  Discharge Assessment     Demographic Factors:  Adolescent or young adult, Caucasian and Low socioeconomic status  Mental Status Per Nursing Assessment::   On Admission:     Current Mental Status by Physician: NA  Loss Factors: Financial problems/change in socioeconomic status  Historical Factors: Prior suicide attempts  Risk Reduction Factors:   Sense of responsibility to family, Religious beliefs about death and Living with another person, especially a relative  Continued Clinical Symptoms:  Schizophrenia:   Paranoid or undifferentiated type  Cognitive Features That Contribute To Risk:  Polarized thinking    Suicide Risk:  Minimal: No identifiable suicidal ideation.  Patients presenting with no risk factors but with morbid ruminations; may be classified as minimal risk based on the severity of the depressive symptoms  Discharge Diagnoses:   AXIS I:  Schizoaffective Disorder AXIS II:  Deferred AXIS III:   Past Medical History  Diagnosis Date  . Thyroid disease   . Paranoid schizophrenia    AXIS IV:  economic problems, occupational problems, other psychosocial or environmental problems and problems related to social environment AXIS V:  41-50 serious symptoms  Plan Of Care/Follow-up recommendations:  Activity:  As tolerated Diet:  Regular  Is patient on multiple antipsychotic therapies at discharge:  No   Has Patient had three or more failed trials of antipsychotic monotherapy by history:  No  Recommended Plan for Multiple Antipsychotic Therapies: Not applicable  Kaniyah Lisby,JANARDHAHA R. 12/06/2012, 11:53 AM

## 2012-12-06 NOTE — ED Provider Notes (Signed)
Patient presents with suicidal thoughts. She was sleeping this morning. Telepsych has recommended inpatient treatment.  Dawn Fletcher. Rubin Payor, MD 12/06/12 [phone]

## 2012-12-06 NOTE — ED Notes (Signed)
Pt being discharged to home. Will take the bus. All home medications explained to Pt. All belongings returned. Pt denies SI and HI. Given support, reassurance and praise.

## 2013-10-13 ENCOUNTER — Emergency Department (HOSPITAL_COMMUNITY)
Admission: EM | Admit: 2013-10-13 | Discharge: 2013-10-13 | Disposition: A | Discharge status: Home / Self Care | Payer: Medicare Other | Attending: Emergency Medicine | Admitting: Emergency Medicine

## 2013-10-13 ENCOUNTER — Emergency Department (HOSPITAL_COMMUNITY): Payer: Medicare Other

## 2013-10-13 ENCOUNTER — Encounter (HOSPITAL_COMMUNITY): Payer: Self-pay | Admitting: Emergency Medicine

## 2013-10-13 DIAGNOSIS — E079 Disorder of thyroid, unspecified: Secondary | ICD-10-CM | POA: Insufficient documentation

## 2013-10-13 DIAGNOSIS — F200 Paranoid schizophrenia: Secondary | ICD-10-CM | POA: Insufficient documentation

## 2013-10-13 DIAGNOSIS — R0789 Other chest pain: Secondary | ICD-10-CM

## 2013-10-13 DIAGNOSIS — R197 Diarrhea, unspecified: Secondary | ICD-10-CM | POA: Insufficient documentation

## 2013-10-13 DIAGNOSIS — F209 Schizophrenia, unspecified: Secondary | ICD-10-CM

## 2013-10-13 DIAGNOSIS — Z79899 Other long term (current) drug therapy: Secondary | ICD-10-CM | POA: Insufficient documentation

## 2013-10-13 LAB — CBC WITH DIFFERENTIAL/PLATELET
Eosinophils Absolute: 0.1 10*3/uL (ref 0.0–0.7)
Hemoglobin: 11 g/dL — ABNORMAL LOW (ref 12.0–15.0)
Lymphs Abs: 2.9 10*3/uL (ref 0.7–4.0)
MCH: 27.8 pg (ref 26.0–34.0)
Monocytes Relative: 7 % (ref 3–12)
Neutrophils Relative %: 38 % — ABNORMAL LOW (ref 43–77)
RBC: 3.95 MIL/uL (ref 3.87–5.11)

## 2013-10-13 LAB — BASIC METABOLIC PANEL
BUN: 17 mg/dL (ref 6–23)
Chloride: 110 mEq/L (ref 96–112)
Glucose, Bld: 84 mg/dL (ref 70–99)
Potassium: 4.1 mEq/L (ref 3.5–5.1)

## 2013-10-13 NOTE — ED Provider Notes (Signed)
CSN: [id number]     Arrival date & time 10/13/13  0358 History   First MD Initiated Contact with Patient 10/13/13 [phone]     Chief Complaint  Patient presents with  . Chest Pain   (Consider location/radiation/quality/duration/timing/severity/associated sxs/prior Treatment) HPI Comments: 57 yo female with schizophrenia hx presents with left and right chest pain, suctioning" sensation, similar to previous, non radiating, no exertional or diaphoresis.  No heart hx.  NO cardiac risk factors.  No PE or recent surgery hx.  Diarrhea recently.    Patient is a 57 y.o. female presenting with chest pain. The history is provided by the patient.  Chest Pain Pain location:  R lateral chest and L lateral chest Associated symptoms: no abdominal pain, no back pain, no fever, no headache, no shortness of breath and not vomiting     Past Medical History  Diagnosis Date  . Thyroid disease   . Paranoid schizophrenia    Past Surgical History  Procedure Laterality Date  . Abdominal hysterectomy     No family history on file. History  Substance Use Topics  . Smoking status: Never Smoker   . Smokeless tobacco: Not on file  . Alcohol Use: No   OB History   Grav Para Term Preterm Abortions TAB SAB Ect Mult Living                 Review of Systems  Constitutional: Negative for fever and chills.  HENT: Negative for congestion.   Eyes: Negative for visual disturbance.  Respiratory: Negative for shortness of breath.   Cardiovascular: Positive for chest pain.  Gastrointestinal: Positive for diarrhea. Negative for vomiting and abdominal pain.  Genitourinary: Negative for dysuria and flank pain.  Musculoskeletal: Negative for back pain, neck pain and neck stiffness.  Skin: Negative for rash.  Neurological: Negative for light-headedness and headaches.    Allergies  Review of patient's allergies indicates no known allergies.  Home Medications   Current Outpatient Rx  Name  Route  Sig  Dispense   Refill  . acetaminophen (TYLENOL) 500 MG tablet   Oral   Take 500 mg by mouth every 6 (six) hours as needed (pain).         . Levothyroxine Sodium (SYNTHROID PO)   Oral   Take by mouth. Pt unsure of where she got it filled.         . risperiDONE (RISPERDAL) 1 MG tablet   Oral   Take 1 mg by mouth at bedtime.           BP 126/79  Pulse 64  Temp(Src) 97.6 F (36.4 C) (Oral)  Resp 16  Ht 5\' 9"  (1.753 m)  Wt 158 lb (71.668 kg)  BMI 23.32 kg/m2  SpO2 97% Physical Exam  Nursing note and vitals reviewed. Constitutional: She is oriented to person, place, and time. She appears well-developed and well-nourished.  HENT:  Head: Normocephalic and atraumatic.  Eyes: Conjunctivae are normal. Right eye exhibits no discharge. Left eye exhibits no discharge.  Neck: Normal range of motion. Neck supple. No tracheal deviation present.  Cardiovascular: Normal rate, regular rhythm and intact distal pulses.   Pulmonary/Chest: Effort normal and breath sounds normal.  Abdominal: Soft. She exhibits no distension. There is no tenderness. There is no guarding.  Musculoskeletal: She exhibits no edema and no tenderness.  Neurological: She is alert and oriented to person, place, and time.  Skin: Skin is warm. No rash noted.  Psychiatric: She has a normal mood and affect.  ED Course  Procedures (including critical care time) Labs Review Labs Reviewed  CBC WITH DIFFERENTIAL - Abnormal; Notable for the following:    Hemoglobin 11.0 (*)    HCT 34.2 (*)    RDW 16.6 (*)    Neutrophils Relative % 38 (*)    Lymphocytes Relative 54 (*)    All other components within normal limits  BASIC METABOLIC PANEL  TROPONIN I   Imaging Review Dg Chest 2 View  10/13/2013   CLINICAL DATA:  Mid right chest pain and shortness of breath.  EXAM: CHEST  2 VIEW  COMPARISON:  None.  FINDINGS: The lungs are well-aerated and clear. There is no evidence of focal opacification, pleural effusion or pneumothorax. An  apparent calcified granuloma is noted at the left midlung zone.  The heart is normal in size; the mediastinal contour is within normal limits. No acute osseous abnormalities are seen.  IMPRESSION: No acute cardiopulmonary process seen.   Electronically Signed   By: Roanna Raider M.D.   On: 10/13/2013 06:43    EKG Interpretation    Date/Time:  Monday October 13 2013 04:36:44 EST Ventricular Rate:  53 PR Interval:  156 QRS Duration: 112 QT Interval:  459 QTC Calculation: 431 R Axis:   -2 Text Interpretation:  Sinus rhythm Borderline intraventricular conduction delay Confirmed by Vianney Kopecky  MD, Jakelyn Squyres ([id number]) on 10/13/2013 5:41:58 AM            MDM   1. Atypical chest pain   2. Schizophrenia    Well appearing, low risk CAD and very atypical sxs. EKG no acute findings.   Discussed close outpt fup, if pain returns pt may need stress test.  Results and differential diagnosis were discussed with the patient. Close follow up outpatient was discussed, patient comfortable with the plan.   Diagnosis: Chest pain atypical     Enid Skeens, MD 10/13/13 [phone]

## 2013-10-13 NOTE — ED Notes (Signed)
Patient transported to X-ray 

## 2013-10-13 NOTE — ED Notes (Signed)
Pt c/o bilat rib pain and L chest pain described as "folding like an accordion" x 3 weeks. Diarrhea within last few days

## 2014-08-18 ENCOUNTER — Inpatient Hospital Stay
Admit: 2014-08-18 | Discharge: 2014-08-19 | Disposition: A | Discharge status: Home / Self Care | Payer: Self-pay | Attending: Emergency Medicine

## 2014-08-18 DIAGNOSIS — T43592A Poisoning by other antipsychotics and neuroleptics, intentional self-harm, initial encounter: Secondary | ICD-10-CM

## 2014-08-18 LAB — SALICYLATE: Salicylate level: 3 MG/DL — ABNORMAL LOW (ref 10–20)

## 2014-08-18 LAB — METABOLIC PANEL, COMPREHENSIVE
A-G Ratio: 1.1 — ABNORMAL LOW (ref 1.2–3.5)
ALT (SGPT): 23 U/L (ref 12–65)
AST (SGOT): 18 U/L (ref 15–37)
Albumin: 3.8 g/dL (ref 3.5–5.0)
Alk. phosphatase: 97 U/L (ref 50–136)
Anion gap: 12 mmol/L (ref 7–16)
BUN: 14 MG/DL (ref 6–23)
Bilirubin, total: 0.2 MG/DL (ref 0.2–1.1)
CO2: 24 mmol/L (ref 21–32)
Calcium: 8.9 MG/DL (ref 8.3–10.4)
Chloride: 107 mmol/L (ref 98–107)
Creatinine: 0.7 MG/DL (ref 0.6–1.0)
GFR est AA: >60 mL/min/{1.73_m2} (ref >60)
GFR est non-AA: >60 mL/min/{1.73_m2} (ref >60)
Globulin: 3.6 g/dL — ABNORMAL HIGH (ref 2.3–3.5)
Glucose: 103 mg/dL — ABNORMAL HIGH (ref 65–100)
Potassium: 3.8 mmol/L (ref 3.5–5.1)
Protein, total: 7.4 g/dL (ref 6.3–8.2)
Sodium: 143 mmol/L (ref 136–145)

## 2014-08-18 LAB — CK: CK: 72 U/L (ref 21–215)

## 2014-08-18 LAB — CBC WITH AUTOMATED DIFF
ABS. BASOPHILS: 0 10*3/uL (ref 0.0–0.2)
ABS. EOSINOPHILS: 0.1 10*3/uL (ref 0.0–0.8)
ABS. IMM. GRANS.: 0 10*3/uL (ref 0.0–0.5)
ABS. LYMPHOCYTES: 2.5 10*3/uL (ref 0.5–4.6)
ABS. MONOCYTES: 0.4 10*3/uL (ref 0.1–1.3)
ABS. NEUTROPHILS: 4.4 10*3/uL (ref 1.7–8.2)
BASOPHILS: 0 % (ref 0.0–2.0)
EOSINOPHILS: 1 % (ref 0.5–7.8)
HCT: 44 % (ref 35.8–46.3)
HGB: 14.8 g/dL (ref 11.7–15.4)
IMMATURE GRANULOCYTES: 0.3 % (ref 0.0–5.0)
LYMPHOCYTES: 34 % (ref 13–44)
MCH: 32.2 PG (ref 26.1–32.9)
MCHC: 33.6 g/dL (ref 31.4–35.0)
MCV: 95.7 FL (ref 79.6–97.8)
MONOCYTES: 5 % (ref 4.0–12.0)
MPV: 10.8 FL (ref 10.8–14.1)
NEUTROPHILS: 60 % (ref 43–78)
PLATELET: 310 10*3/uL (ref 150–450)
RBC: 4.6 M/uL (ref 4.05–5.25)
RDW: 13.7 % (ref 11.9–14.6)
WBC: 7.4 10*3/uL (ref 4.3–11.1)

## 2014-08-18 LAB — ACETAMINOPHEN: Acetaminophen level: 0 ug/mL — ABNORMAL LOW (ref 10.0–30.0)

## 2014-08-18 LAB — ETHYL ALCOHOL: ALCOHOL(ETHYL),SERUM: <3 MG/DL

## 2014-08-18 NOTE — ED Notes (Signed)
Dr Konrad PentaStauter at bedside to assess.

## 2014-08-18 NOTE — ED Notes (Signed)
Pt. Asleep on stretcher with resp even and non-labored. No change in behavior noted at this time

## 2014-08-18 NOTE — ED Notes (Signed)
Pt sleeping undisturbed at this time

## 2014-08-18 NOTE — ED Notes (Signed)
sts took risperdal last pm in attempted suicide- sts approx 2.5 week supply  (approx 16-17 pills)

## 2014-08-18 NOTE — Progress Notes (Addendum)
Visited with patient [phone]) at [place] request.  58 year old received A&Ox 3 sitting up in wheelchair.  Patient states 'I took my bottle of Resperidol' (16-17 pills last night) because 'I thought i would go to sleep and not wake up".  States she took the pills because she has been 'so sick' with being 'hypertired' and 'talking to myself making me hoarse'. States gets around via city bus and that is how she got here.  Works at [employer] and has been there for 2 months.  States was married in the past to Leanna Battles but they have not been together in 42yr and have not seen each other in two.  She has no children and is an only child.  Both her parents are deceased.  She has no cousins but has 5 aunts, [place].  [place]  [place] and his wife. States wife worked at [employer] got her a job.  [place].  Patient states she lives in an apartment with her two 'internet friends' named LLattie Hawand SManuela Schwartz  She clarifies that they are friends she met on the internet.  States she puts a bed on the floor in the living room to sleep on at night and they stand it up during the day.  She says that she can go back there to stay when she is ready.  Patient states "I am a paranoid schitzophrenic' and also says she is disabled.  Report to [employer]-  commitment papers completed and supervisor contacted to notarize. Face sheet and provider notes printed -  await notarization and lab values to come back and chart will be faxed to 5 facilities I have provided in report. Will follow if she remains in the ED in the am.

## 2014-08-18 NOTE — ED Notes (Signed)
Pt back in room, meal provided. Pt aware of the need to provide urine specimen.

## 2014-08-18 NOTE — ED Notes (Signed)
Jodi Raymond at bedside to assess situation for possible placement in a Psych facility.

## 2014-08-18 NOTE — ED Notes (Signed)
Pt taken to room 308 for a shower.

## 2014-08-18 NOTE — ED Notes (Addendum)
Pt disheveled, confused. Reports she took all her Risperidol last night in order to make the voice she was hearing stop. She took the bus to her normal stop but it was raining and so she walked to another bus stop to get out of the rain. Pt reports that she has 2 roommates but was unclear about why she didn't go home. Pt and her clothing is very wet and smelling strongly of urine though she denies any loss of bladder control. Clothing and belongings removed, provided with disposable clothing. Plan of care explained to pt, she reports understanding of the rules and states she does not have a plan to harm herself or anyone else at this time.

## 2014-08-18 NOTE — ED Provider Notes (Addendum)
HPI Comments: Patient reports taking the rest of her Risperdal last night in an attempt to go to sleep and never wake up.  She states it was approximately 16-17 pills.  She was walking around the park in the rain today and figured she would take a bus to the ER because her doctor told her if she "ever did anything like that she should go to the hospital."  She states she took the pills because she was tired of talking to herself and her ears ringing.  She does have a history of paranoid schizophrenia.  She reports a recent cough and otherwise no changes in medications.    Patient is a 58 y.o. female presenting with mental health disorder. The history is provided by the patient.   Mental Health Problem   This is a recurrent problem. The current episode started yesterday. The problem has not changed since onset.Pertinent negatives include no confusion and no weakness. Mental status baseline is normal.  Risk factors include overdose. Her past medical history is significant for psychotropic medication treatment.                                    Past Medical History   Diagnosis Date   ??? Endocrine disease    ??? Psychiatric disorder      paranoid schizophrenia   ;     Past Surgical History   Procedure Laterality Date   ??? Hx gyn           History reviewed. No pertinent family history.     History     Social History   ??? Marital Status: MARRIED     Spouse Name: N/A     Number of Children: N/A   ??? Years of Education: N/A     Occupational History   ??? Not on file.     Social History Main Topics   ??? Smoking status: Current Every Day Smoker -- 1.00 packs/day   ??? Smokeless tobacco: Not on file   ??? Alcohol Use: No   ??? Drug Use: No   ??? Sexual Activity: Not on file     Other Topics Concern   ??? Not on file     Social History Narrative   ??? No narrative on file                  ALLERGIES: Review of patient's allergies indicates no known allergies.      Review of Systems   Constitutional: Negative for fever and chills.    HENT: Negative for hearing loss.    Eyes: Negative for visual disturbance.   Respiratory: Positive for cough. Negative for shortness of breath.    Cardiovascular: Negative for chest pain and palpitations.   Gastrointestinal: Negative for nausea, vomiting, abdominal pain and diarrhea.   Genitourinary: Positive for dysuria.   Musculoskeletal: Negative for back pain.   Skin: Negative for rash.   Neurological: Negative for weakness and headaches.   Psychiatric/Behavioral: Positive for suicidal ideas and dysphoric mood. Negative for confusion.       There were no vitals filed for this visit.         Physical Exam   Constitutional: She appears well-developed and well-nourished.   HENT:   Head: Normocephalic and atraumatic.   Right Ear: External ear normal.   Left Ear: External ear normal.   Nose: Nose normal.   Mouth/Throat: Oropharynx is clear  and moist.   Eyes: Conjunctivae are normal. Pupils are equal, round, and reactive to light.   Neck: Normal range of motion. Neck supple.   Cardiovascular: Regular rhythm, normal heart sounds and intact distal pulses.    Pulmonary/Chest: Effort normal and breath sounds normal. No respiratory distress. She has no wheezes.   Abdominal: Soft. Bowel sounds are normal. She exhibits no distension. There is no tenderness.   Musculoskeletal: Normal range of motion. She exhibits no edema.   Neurological: She is alert.   Skin: Skin is warm and dry.   Psychiatric: Her affect is blunt. Her speech is tangential. She is slowed. She expresses impulsivity and inappropriate judgment. She expresses suicidal ideation.   Nursing note and vitals reviewed.       MDM  Number of Diagnoses or Management Options     Amount and/or Complexity of Data Reviewed  Clinical lab tests: ordered and reviewed    Risk of Complications, Morbidity, and/or Mortality  Presenting problems: moderate  Diagnostic procedures: moderate  Management options: moderate  General comments: Will check labs and place on white papers     Patient Progress  Patient progress: stable                          Diagnoses that have been ruled out:   None   Diagnoses that are still under consideration:   None   Final diagnoses:   None       Procedures      ===================================================================  Assessment of suicidal patient:  --Primarily based on clinician judgement while weighing risk and protective factors  --3 Red Flags: rational thinking loss, ideation, organized/serious plan    INCREASED RISK  Trigger    -acute stressor.     NO:   RATIONAL THINKING   -psychosis/agitation  NO: pt with hx of schizophrenia, not delusion, does not appear psychotic   Age - 58 y.o.   -higher risk 15-24, >65 NO  Access to means  -firearms or weapons  NO:   Previous attempts  -    YES:   Previous psych care  -inpatient   YES  Excessive EtOH/Drugs -increases impulsivity  NO:   Depression/Hopelessness -    NO:   Sickness   -medical illness  NO:    IDEATION   -SI is major precursor  NO: does not sound premeditated  Lack of Social Support -perceived lack of support YES: pt with no local family  ORGANIZED/SERIOUS SI -detailed/lethal inc risk NO: opportunity    DECREASED RISK  Social Support  -Married with children  NO:   Awareness   -Good insight/coping  YES: pt asking for help  Future Oriented  -    YES:    Engaged   -open to participation  YES:     ================================================================  [facility] Psychiatric RECHECK NOTE    Deon Ivey is a 58 y.o. female here for intentional overdose, requesting psych care  she arrived on 08/18/2014    she is on papers    she has not completed a tele-psych evaluation    Subjective:pt sitting up, eating breakfast, well appearing, non toxic    Objective:  There were no vitals taken for this visit.  Recent Results (from the past 24 hour(s))   CBC WITH AUTOMATED DIFF    Collection Time: 08/18/14  4:50 PM   Result Value Ref Range    WBC 7.4 4.3 - 11.1 K/uL  RBC 4.60 4.05 - 5.25 M/uL    HGB 14.8 11.7 - 15.4 g/dL    HCT 44.0 35.8 - 46.3 %    MCV 95.7 79.6 - 97.8 FL    MCH 32.2 26.1 - 32.9 PG    MCHC 33.6 31.4 - 35.0 g/dL    RDW 13.7 11.9 - 14.6 %    PLATELET 310 150 - 450 K/uL    MPV 10.8 10.8 - 14.1 FL    DF AUTOMATED      NEUTROPHILS 60 43 - 78 %    LYMPHOCYTES 34 13 - 44 %    MONOCYTES 5 4.0 - 12.0 %    EOSINOPHILS 1 0.5 - 7.8 %    BASOPHILS 0 0.0 - 2.0 %    IMMATURE GRANULOCYTES 0.3 0.0 - 5.0 %    ABS. NEUTROPHILS 4.4 1.7 - 8.2 K/UL    ABS. LYMPHOCYTES 2.5 0.5 - 4.6 K/UL    ABS. MONOCYTES 0.4 0.1 - 1.3 K/UL    ABS. EOSINOPHILS 0.1 0.0 - 0.8 K/UL    ABS. BASOPHILS 0.0 0.0 - 0.2 K/UL    ABS. IMM. GRANS. 0.0 0.0 - 0.5 K/UL   METABOLIC PANEL, COMPREHENSIVE    Collection Time: 08/18/14  4:50 PM   Result Value Ref Range    Sodium 143 136 - 145 mmol/L    Potassium 3.8 3.5 - 5.1 mmol/L    Chloride 107 98 - 107 mmol/L    CO2 24 21 - 32 mmol/L    Anion gap 12 7 - 16 mmol/L    Glucose 103 (H) 65 - 100 mg/dL    BUN 14 6 - 23 MG/DL    Creatinine 0.70 0.6 - 1.0 MG/DL    GFR est AA >60 >60 ml/min/1.60m    GFR est non-AA >60 >60 ml/min/1.772m   Calcium 8.9 8.3 - 10.4 MG/DL    Bilirubin, total 0.2 0.2 - 1.1 MG/DL    ALT 23 12 - 65 U/L    AST 18 15 - 37 U/L    Alk. phosphatase 97 50 - 136 U/L    Protein, total 7.4 6.3 - 8.2 g/dL    Albumin 3.8 3.5 - 5.0 g/dL    Globulin 3.6 (H) 2.3 - 3.5 g/dL    A-G Ratio 1.1 (L) 1.2 - 3.5     CK    Collection Time: 08/18/14  4:50 PM   Result Value Ref Range    CK 72 21 - 21160/L   SALICYLATE    Collection Time: 08/18/14  4:50 PM   Result Value Ref Range    SALICYLATE 3.0 (L) 10 - 20 MG/DL   ACETAMINOPHEN    Collection Time: 08/18/14  4:50 PM   Result Value Ref Range    ACETAMINOPHEN 0 (L) 10.0 - 30.0 ug/mL   ETHYL ALCOHOL    Collection Time: 08/18/14  4:50 PM   Result Value Ref Range    ALCOHOL(ETHYL),SERUM <3 MG/DL   DRUG SCREEN, URINE    Collection Time: 08/18/14  9:05 PM   Result Value Ref Range    PCP(PHENCYCLIDINE) NEGATIVE        BENZODIAZEPINE NEGATIVE       COCAINE NEGATIVE       AMPHETAMINE NEGATIVE       METHADONE NEGATIVE       THC (TH-CANNABINOL) NEGATIVE       OPIATES NEGATIVE       BARBITURATES NEGATIVE        No  results found.    Assessment: pt awake alert, conversant  Feels remorse for suicide attempt  Moved here from [place], [place] -- last hospitalized there 10 months ago-- attempting to get records  Pt is not agitated  Not responding to internal stimuli  Thought content tangential, no bizzare content    Plan: continue to monitor, restart meds  Call from [place] this morning, she is on the wait list (~#5-6)     Mamie Nick, MD, 7:47 AM, 08/19/2014  ================================================================    Pt sitting up  Denies SI  No hallucinations  Wants to live and be grateful for the world  Follow up with mental health tomorrow am.  Mamie Nick, MD; 08/19/2014 @1 :30 PM  ===================================================================

## 2014-08-19 LAB — DRUG SCREEN, URINE
AMPHETAMINES: NEGATIVE
BARBITURATES: NEGATIVE
BENZODIAZEPINES: NEGATIVE
COCAINE: NEGATIVE
METHADONE: NEGATIVE
OPIATES: NEGATIVE
PCP(PHENCYCLIDINE): NEGATIVE
THC (TH-CANNABINOL): NEGATIVE

## 2014-08-19 MED ORDER — LEVOTHYROXINE 100 MCG TAB
100 mcg | Freq: Every day | ORAL | Status: DC
Start: 2014-08-19 — End: 2014-08-19

## 2014-08-19 MED ORDER — RISPERIDONE 0.5 MG TAB
0.5 mg | Freq: Every day | ORAL | Status: DC
Start: 2014-08-19 — End: 2014-08-19
  Administered 2014-08-19: 12:00:00 via ORAL

## 2014-08-19 MED FILL — LEVOTHROID 100 MCG TABLET: 100 mcg | ORAL | Qty: 1

## 2014-08-19 MED FILL — RISPERIDONE 0.5 MG TAB: 0.5 mg | ORAL | Qty: 2

## 2014-08-19 NOTE — Other (Signed)
I have reviewed discharge instructions with the patient.  The patient verbalized understanding. Patient given discharge paperwork and instruction given on F?U. Patient able to walk from facility w/o assistance. Patient assured staff she does have transportation. No new prescriptions today.

## 2014-08-19 NOTE — Progress Notes (Addendum)
Patient continues to come up to nurses station to request lunch and wanting to know when she can go home. She is assured that lunch has been ordered and we are waiting for it to be delivered.

## 2014-08-19 NOTE — ED Notes (Signed)
Resting on stretcher in no acute distress at this time

## 2014-08-19 NOTE — Progress Notes (Signed)
Nutrition:    SAD Referral. RD contacted dining on call operator to notify them pt will require disposable silverware given suicidal ideations. Dining on Call Operator aware and has placed note in diet order.    Su LeyErin Willey, MS,RD/LD [phone]

## 2014-08-19 NOTE — ED Notes (Signed)
Assumed care of pt, she is resting with eyes closed in no apparent distress.

## 2014-08-19 NOTE — ED Notes (Signed)
Pt resting with eye closed

## 2014-08-19 NOTE — Progress Notes (Addendum)
[facility] - received [facility] but have no beds.  [facility] - info received and in process of review. In to speak with patient and let her know that records were faxed but that we were waiting on the facilities to get back to us. Notified patient that her insurance info she gave us was invalid and she asked to use the phone.  After 20 minutes on the phone with [phone]) she said that her information was sent to her old address because she forgot to update it.   She states they said she has to go to the social security office and get it fixed and would like to go home.  Notified patient that she cannot go home - that she is committed because she is suicidal.  Patient states 'I'm not suicidal anymore'. Dr Mikey BussingHoffman notified.

## 2014-08-19 NOTE — ED Notes (Signed)
Synthroid sent from pharmacy and held due to pt has already eaten breakfast.

## 2014-08-19 NOTE — ED Notes (Signed)
Pt. Remains asleep on stretcher with eyes closed. Resp. Even and non-labored.

## 2014-08-19 NOTE — ED Notes (Signed)
Pt up to restroom, requesting breakfast with decaf coffee. Pt very polite.

## 2014-08-19 NOTE — ED Notes (Signed)
Pt. Asleep on stretcher, resp. Even and non-labored. No change noted at this time

## 2014-08-19 NOTE — ED Notes (Signed)
Pt up on side of bed with breakfast tray placed in front of her. Dr. Mikey BussingHoffman at bedside talking with pt.

## 2014-08-19 NOTE — ED Notes (Signed)
Pt. requesting coffee. Denied and offered non-caffeinated product.

## 2015-08-18 DIAGNOSIS — F419 Anxiety disorder, unspecified: Secondary | ICD-10-CM

## 2015-08-18 NOTE — ED Triage Notes (Signed)
Pt. Presents to er with c/o i want to kill my self with a smile on her face.will not give specifics. States has been to bible study. Flight of ideas. On and off thoughts her whole psychiatric life.

## 2015-08-19 ENCOUNTER — Inpatient Hospital Stay
Admit: 2015-08-19 | Discharge: 2015-08-19 | Disposition: A | Discharge status: Home / Self Care | Payer: Self-pay | Attending: Emergency Medicine

## 2015-08-19 LAB — DRUG SCREEN, URINE
AMPHETAMINES: NEGATIVE
BARBITURATES: NEGATIVE
BENZODIAZEPINES: NEGATIVE
COCAINE: NEGATIVE
METHADONE: NEGATIVE
OPIATES: NEGATIVE
PCP(PHENCYCLIDINE): NEGATIVE
THC (TH-CANNABINOL): NEGATIVE

## 2015-08-19 NOTE — ED Notes (Signed)
Gave pt giner ale per md request. Ginger, unit secretary made calls to get tele-psych ready.

## 2015-08-19 NOTE — ED Notes (Signed)
Provided pt with sandwich box and gatorade per request.

## 2015-08-19 NOTE — ED Provider Notes (Addendum)
HPI Comments: 59 year old female brought a bus to the emergency department this evening.  I'll difficulty and clear history.  She states some problems with depression and some suicidal ideations that have been going on for a few months.  She states she has +1 to hang herself.  She states she has been reading the Bible and feels that Prudy FeelerSatan is after her.  She also is depressed because an engagement was broken off 5 months ago.  She denies to me any auditory or visual hallucinations.  She has not been sleeping well.  She is smoking less.  I does not drink alcohol.  Does take her Risperdal.  [facility] [place] [facility] and has a history of hysterectomy.    Patient is a 59 y.o. female presenting with mental health disorder. The history is provided by the patient.   Mental Health Problem    This is a new problem. The current episode started more than 1 week ago. The problem has been gradually worsening. Associated symptoms include delusions. Pertinent negatives include no confusion, no somnolence, no seizures, no unresponsiveness, no weakness, no hallucinations, no tingling and no numbness. Mental status baseline is normal.  Her past medical history does not include diabetes, seizures, CVA, TIA, hypertension, COPD, head trauma or heart disease.        Past Medical History:   Diagnosis Date   ??? Endocrine disease    ??? Psychiatric disorder      paranoid schizophrenia       Past Surgical History:   Procedure Laterality Date   ??? Hx gyn           History reviewed. No pertinent family history.    Social History     Social History   ??? Marital status: MARRIED     Spouse name: N/A   ??? Number of children: N/A   ??? Years of education: N/A     Occupational History   ??? Not on file.     Social History Main Topics   ??? Smoking status: Current Every Day Smoker     Packs/day: 1.00   ??? Smokeless tobacco: Not on file   ??? Alcohol use No   ??? Drug use: No   ??? Sexual activity: Not on file     Other Topics Concern   ??? Not on file      Social History Narrative         ALLERGIES: Review of patient's allergies indicates no known allergies.    Review of Systems   Constitutional: Negative for chills and fever.   Respiratory: Negative for cough and shortness of breath.    Cardiovascular: Negative for chest pain and palpitations.   Gastrointestinal: Negative for diarrhea, nausea and vomiting.   Neurological: Negative for tingling, seizures, weakness and numbness.   Psychiatric/Behavioral: Negative for confusion and hallucinations.       Vitals:    08/18/15 2356   BP: 142/81   Pulse: 67   Resp: 16   Temp: 98.3 ??F (36.8 ??C)   SpO2: 100%   Weight: 86.2 kg (190 lb)   Height: 5\' 10"  (1.778 m)            Physical Exam   Constitutional: She is oriented to person, place, and time. She appears well-developed and well-nourished. No distress.   HENT:   Head: Normocephalic and atraumatic.   Mouth/Throat: Oropharynx is clear and moist. No oropharyngeal exudate.   Eyes: Conjunctivae and EOM are normal. Pupils are equal, round, and reactive  to light.   Neck: Normal range of motion. Neck supple.   Cardiovascular: Normal rate, regular rhythm and intact distal pulses.    No murmur heard.  Pulmonary/Chest: Breath sounds normal. No respiratory distress.   Abdominal: No hernia.   Neurological: She is alert and oriented to person, place, and time. Gait normal.   Nl speech   Skin: Skin is warm and dry.   Psychiatric: Her affect is inappropriate. Her speech is tangential. She expresses inappropriate judgment. She expresses suicidal ideation. She expresses no homicidal ideation. She expresses suicidal plans. She is inattentive.   Nursing note and vitals reviewed.       MDM  Number of Diagnoses or Management Options  Diagnosis management comments: Assessor for illicit substances with urine drug screen.  Psychiatry interview.       Amount and/or Complexity of Data Reviewed  Clinical lab tests: reviewed    Risk of Complications, Morbidity, and/or Mortality   Presenting problems: moderate  Diagnostic procedures: low  Management options: low    Patient Progress  Patient progress: stable    ED Course       Procedures    Results Include:    Recent Results (from the past 24 hour(s))   DRUG SCREEN, URINE    Collection Time: 08/19/15 12:00 AM   Result Value Ref Range    PCP(PHENCYCLIDINE) NEGATIVE       BENZODIAZEPINE NEGATIVE       COCAINE NEGATIVE       AMPHETAMINE NEGATIVE       METHADONE NEGATIVE       THC (TH-CANNABINOL) NEGATIVE       OPIATES NEGATIVE       BARBITURATES NEGATIVE        Psychiatry's interviewed the patient feels they have stable for discharge.

## 2015-08-19 NOTE — ED Notes (Signed)
Pt was very anxious to go home and verbalized understanding of discharge instructions and need to call therapist in am. Pt signed form and ambulated out in nad

## 2015-08-19 NOTE — ED Notes (Signed)
Tele Psych called and spoke with pt who denied any present thoughts of suicide. Pt agreed to call mental health in the am and get a sooner appt than her regular in mid November. Notified Dr. Irving CopasFinn

## 2016-04-15 ENCOUNTER — Inpatient Hospital Stay
Admit: 2016-04-15 | Discharge: 2016-04-16 | Disposition: A | Discharge status: Other Acute Inpatient Hospital | Payer: Self-pay | Attending: Emergency Medicine

## 2016-04-15 DIAGNOSIS — F209 Schizophrenia, unspecified: Secondary | ICD-10-CM

## 2016-04-15 LAB — URINALYSIS W/MICROSCOPIC
Bilirubin UA, confirm: NEGATIVE
Blood: NEGATIVE
Glucose: NEGATIVE mg/dL
Ketone: NEGATIVE mg/dL
Nitrites: POSITIVE — AB
Protein: NEGATIVE mg/dL
Specific gravity: 1.025 (ref 1.003–1.030)
Urobilinogen: 0.2 EU/dL (ref 0.2–1.0)
pH (UA): 5.5 (ref 5.0–8.0)

## 2016-04-15 LAB — DRUG SCREEN, URINE
AMPHETAMINES: NEGATIVE
BARBITURATES: NEGATIVE
BENZODIAZEPINES: NEGATIVE
COCAINE: NEGATIVE
METHADONE: NEGATIVE
OPIATES: NEGATIVE
PCP(PHENCYCLIDINE): NEGATIVE
THC (TH-CANNABINOL): NEGATIVE

## 2016-04-15 LAB — METABOLIC PANEL, COMPREHENSIVE
A-G Ratio: 1.2 (ref 1.1–2.2)
ALT (SGPT): 19 U/L (ref 12–78)
AST (SGOT): 14 U/L — ABNORMAL LOW (ref 15–37)
Albumin: 3.6 g/dL (ref 3.5–5.0)
Alk. phosphatase: 83 U/L (ref 45–117)
Anion gap: 6 mmol/L (ref 5–15)
BUN/Creatinine ratio: 20 (ref 12–20)
BUN: 13 MG/DL (ref 6–20)
Bilirubin, total: 0.4 MG/DL (ref 0.2–1.0)
CO2: 24 mmol/L (ref 21–32)
Calcium: 8.5 MG/DL (ref 8.5–10.1)
Chloride: 113 mmol/L — ABNORMAL HIGH (ref 97–108)
Creatinine: 0.66 MG/DL (ref 0.55–1.02)
GFR est AA: >60 mL/min/{1.73_m2} (ref >60)
GFR est non-AA: >60 mL/min/{1.73_m2} (ref >60)
Globulin: 3.1 g/dL (ref 2.0–4.0)
Glucose: 90 mg/dL (ref 65–100)
Potassium: 3.8 mmol/L (ref 3.5–5.1)
Protein, total: 6.7 g/dL (ref 6.4–8.2)
Sodium: 143 mmol/L (ref 136–145)

## 2016-04-15 LAB — HCG URINE, QL. - POC: Pregnancy test,urine (POC): NEGATIVE

## 2016-04-15 LAB — CBC WITH AUTOMATED DIFF
ABS. BASOPHILS: 0 10*3/uL (ref 0.0–0.1)
ABS. EOSINOPHILS: 0 10*3/uL (ref 0.0–0.4)
ABS. LYMPHOCYTES: 2.5 10*3/uL (ref 0.8–3.5)
ABS. MONOCYTES: 0.5 10*3/uL (ref 0.0–1.0)
ABS. NEUTROPHILS: 4.9 10*3/uL (ref 1.8–8.0)
BASOPHILS: 0 % (ref 0–1)
EOSINOPHILS: 1 % (ref 0–7)
HCT: 39.1 % (ref 35.0–47.0)
HGB: 13.8 g/dL (ref 11.5–16.0)
LYMPHOCYTES: 32 % (ref 12–49)
MCH: 33.7 PG (ref 26.0–34.0)
MCHC: 35.3 g/dL (ref 30.0–36.5)
MCV: 95.6 FL (ref 80.0–99.0)
MONOCYTES: 6 % (ref 5–13)
NEUTROPHILS: 61 % (ref 32–75)
PLATELET: 217 10*3/uL (ref 150–400)
RBC: 4.09 M/uL (ref 3.80–5.20)
RDW: 12.8 % (ref 11.5–14.5)
WBC: 7.9 10*3/uL (ref 3.6–11.0)

## 2016-04-15 LAB — ETHYL ALCOHOL: ALCOHOL(ETHYL),SERUM: <10 MG/DL (ref <10)

## 2016-04-15 LAB — SALICYLATE: Salicylate level: 5.2 MG/DL (ref 2.8–20.0)

## 2016-04-15 LAB — ACETAMINOPHEN: Acetaminophen level: <2 ug/mL — ABNORMAL LOW (ref 10–30)

## 2016-04-15 MED ORDER — SODIUM CHLORIDE 0.9 % IV PIGGY BACK
1 gram | Freq: Once | INTRAVENOUS | Status: AC
Start: 2016-04-15 — End: 2016-04-15
  Administered 2016-04-15: 22:00:00 via INTRAVENOUS

## 2016-04-15 MED ORDER — SODIUM CHLORIDE 0.9 % IJ SYRG
INTRAMUSCULAR | Status: DC | PRN
Start: 2016-04-15 — End: 2016-04-16

## 2016-04-15 MED ORDER — SODIUM CHLORIDE 0.9 % IJ SYRG
Freq: Three times a day (TID) | INTRAMUSCULAR | Status: DC
Start: 2016-04-15 — End: 2016-04-16

## 2016-04-15 MED FILL — CEFTRIAXONE 1 GRAM SOLUTION FOR INJECTION: 1 gram | INTRAMUSCULAR | Qty: 1

## 2016-04-15 NOTE — ED Notes (Signed)
Pt arrives via [employer]. Pt found rolling around in a field disoriented. PT has hx of paranoid schizophrenia. Pt is homeless and oriented to person and place. Monitor x 2. Call bell within reach and plan of care discussed.

## 2016-04-15 NOTE — ED Provider Notes (Signed)
HPI Comments: Margretta SidleGloria Leslie Lame is a 60 y.o. female with hx of paranoid schizophrenia who presents via police custody to the ED for mental health evaluation. Per police, pt was found staggering in and out of traffic after receiving calls reporting pt screaming in [place]emetary. He reports noticing a change in pt's mood. Pt affirms taking medications last night. She states she lives in multiple shelters. She specifically denies any fevers, chills, nausea, vomiting, chest pain, shortness of breath, headache, rash, diarrhea, sweating or weight loss.    Social hx: + 1ppd Tobacco use, - EtOH use, - Illicit drug use    PCP: None    There are no other complaints, changes or physical findings at this time.        The history is provided by the patient. No language interpreter was used.        Past Medical History:   Diagnosis Date   ??? Endocrine disease    ??? Psychiatric disorder     paranoid schizophrenia       Past Surgical History:   Procedure Laterality Date   ??? HX GYN           History reviewed. No pertinent family history.    Social History     Social History   ??? Marital status: MARRIED     Spouse name: N/A   ??? Number of children: N/A   ??? Years of education: N/A     Occupational History   ??? Not on file.     Social History Main Topics   ??? Smoking status: Current Every Day Smoker     Packs/day: 1.00   ??? Smokeless tobacco: Never Used   ??? Alcohol use No   ??? Drug use: No   ??? Sexual activity: Not on file     Other Topics Concern   ??? Not on file     Social History Narrative         ALLERGIES: Review of patient's allergies indicates no known allergies.    Review of Systems   Constitutional: Negative.  Negative for activity change, appetite change, chills, fatigue, fever and unexpected weight change.   HENT: Negative.  Negative for congestion, hearing loss, rhinorrhea, sneezing, sore throat and voice change.    Eyes: Negative.  Negative for pain and visual disturbance.    Respiratory: Negative.  Negative for apnea, cough, choking, chest tightness, shortness of breath and wheezing.    Cardiovascular: Negative.  Negative for chest pain and palpitations.   Gastrointestinal: Negative.  Negative for abdominal distention, abdominal pain, blood in stool, constipation, diarrhea, nausea and vomiting.   Genitourinary: Negative.  Negative for difficulty urinating, dysuria, flank pain, frequency, hematuria and urgency.        No discharge   Musculoskeletal: Negative.  Negative for arthralgias, back pain, myalgias and neck stiffness.   Skin: Negative.  Negative for color change and rash.   Allergic/Immunologic: Negative.  Negative for environmental allergies and food allergies.   Neurological: Negative.  Negative for dizziness, seizures, syncope, speech difficulty, weakness, numbness and headaches.   Hematological: Negative for adenopathy.   Psychiatric/Behavioral: Negative for suicidal ideas.        Positive for change in mood.       Vitals:    04/15/16 1800 04/15/16 1900 04/15/16 2000 04/15/16 2100   BP: 132/62 131/66 141/75 (!) 139/92   Pulse:       Resp:       Temp:       SpO2:  96% 96% 93% 96%            Physical Exam   Constitutional: She is oriented to person, place, and time. She appears well-developed and well-nourished. No distress.   HENT:   Head: Normocephalic and atraumatic.   Mouth/Throat: Oropharynx is clear and moist. No oropharyngeal exudate.   Eyes: Conjunctivae and EOM are normal. Pupils are equal, round, and reactive to light. Right eye exhibits no discharge. Left eye exhibits no discharge.   Neck: Normal range of motion. Neck supple.   Cardiovascular: Normal rate, regular rhythm and intact distal pulses.  Exam reveals no gallop and no friction rub.    No murmur heard.  Pulmonary/Chest: Effort normal and breath sounds normal. No respiratory distress. She has no wheezes. She has no rales. She exhibits no tenderness.    Abdominal: Soft. Bowel sounds are normal. She exhibits no distension and no mass. There is no tenderness. There is no rebound and no guarding.   Musculoskeletal: Normal range of motion. She exhibits no edema.   Lymphadenopathy:     She has no cervical adenopathy.   Neurological: She is alert and oriented to person, place, and time. No cranial nerve deficit. Coordination normal.   Skin: Skin is warm and dry. No rash noted. No erythema.   Psychiatric: She has a normal mood and affect.   Nursing note and vitals reviewed.       MDM  Number of Diagnoses or Management Options  Diagnosis management comments: DDx: paranoia/schizophrenia, UTI, drug abuse       Amount and/or Complexity of Data Reviewed  Clinical lab tests: reviewed and ordered  Tests in the medicine section of CPT??: ordered and reviewed  Obtain history from someone other than the patient: yes (police)  Review and summarize past medical records: yes  Independent visualization of images, tracings, or specimens: yes    Patient Progress  Patient progress: stable    ED Course       Procedures     EKG interpretation: (Preliminary) 15:07  Rhythm: normal sinus rhythm; and regular . Rate (approx.): 65 bpm; Axis: normal; PR interval: normal; QRS interval: normal ; ST/T wave: normal;     5:26  Dx pt with UTI. Will treat with IV rocephin.     LABORATORY TESTS:  No results found for this or any previous visit (from the past 12 hour(s)).    IMAGING RESULTS:  CT Results  (Last 48 hours)    None        CXR Results  (Last 48 hours)    None        No orders to display       MEDICATIONS GIVEN:  Medications   cefTRIAXone (ROCEPHIN) 1 g in 0.9% sodium chloride (MBP/ADV) 50 mL (0 g IntraVENous IV Completed 04/15/16 1827)       IMPRESSION:  1. Schizophrenia, schizo-affective type (HCC)    2. Acute cystitis without hematuria        PLAN:      TRANSFER NOTE:  19:11  Pt is being transferred. The reasons for pt's transfer have been discussed  with the pt and available family.  They convey agreement and understanding for the need to be transferred as explained to them by [facility] Hinton RaoStadler, MD      This note is prepared by Lurene ShadowNuzigum Setiwaldi, acting as Neurosurgeoncribe for [facility] L. Hinton RaoStadler, MD.    Raynelle Bringarlton L. Hinton RaoStadler, MD: The scribe's documentation has been prepared under my direction and personally reviewed  by me in its entirety. I confirm that the note above accurately reflects all work, treatment, procedures, and medical decision making performed by me.

## 2016-04-15 NOTE — ED Notes (Signed)
Placed in wrist handcuffs by Deputy after EKG completed.

## 2016-04-16 LAB — EKG, 12 LEAD, INITIAL
Atrial Rate: 65 {beats}/min
Calculated P Axis: 3 degrees
Calculated R Axis: -14 degrees
Calculated T Axis: 14 degrees
Diagnosis: NORMAL
P-R Interval: 154 ms
Q-T Interval: 434 ms
QRS Duration: 96 ms
QTC Calculation (Bezet): 451 ms
Ventricular Rate: 65 {beats}/min

## 2016-04-16 LAB — EKG 12-LEAD
Atrial Rate: 65 {beats}/min
Diagnosis: NORMAL
P Axis: 3 degrees
P-R Interval: 154 ms
Q-T Interval: 434 ms
QRS Duration: 96 ms
QTc Calculation (Bazett): 451 ms
R Axis: -14 degrees
T Axis: 14 degrees
Ventricular Rate: 65 {beats}/min

## 2017-08-02 ENCOUNTER — Emergency Department (HOSPITAL_COMMUNITY): Payer: Self-pay

## 2017-08-02 ENCOUNTER — Emergency Department (HOSPITAL_COMMUNITY)
Admission: EM | Admit: 2017-08-02 | Discharge: 2017-08-02 | Disposition: A | Discharge status: Home / Self Care | Payer: Self-pay | Attending: Emergency Medicine | Admitting: Emergency Medicine

## 2017-08-02 ENCOUNTER — Encounter (HOSPITAL_COMMUNITY): Payer: Self-pay | Admitting: Nurse Practitioner

## 2017-08-02 DIAGNOSIS — H6691 Otitis media, unspecified, right ear: Secondary | ICD-10-CM | POA: Insufficient documentation

## 2017-08-02 DIAGNOSIS — F17200 Nicotine dependence, unspecified, uncomplicated: Secondary | ICD-10-CM | POA: Insufficient documentation

## 2017-08-02 DIAGNOSIS — J069 Acute upper respiratory infection, unspecified: Secondary | ICD-10-CM | POA: Insufficient documentation

## 2017-08-02 DIAGNOSIS — Z79899 Other long term (current) drug therapy: Secondary | ICD-10-CM | POA: Insufficient documentation

## 2017-08-02 DIAGNOSIS — R0789 Other chest pain: Secondary | ICD-10-CM | POA: Insufficient documentation

## 2017-08-02 LAB — CBC WITH DIFFERENTIAL/PLATELET
Basophils Absolute: 0 10*3/uL (ref 0.0–0.1)
Basophils Relative: 0 %
Eosinophils Absolute: 0.1 10*3/uL (ref 0.0–0.7)
Eosinophils Relative: 1 %
HEMATOCRIT: 40.9 % (ref 36.0–46.0)
HEMOGLOBIN: 14.1 g/dL (ref 12.0–15.0)
LYMPHS ABS: 2.4 10*3/uL (ref 0.7–4.0)
LYMPHS PCT: 31 %
MCH: 33.7 pg (ref 26.0–34.0)
MCHC: 34.5 g/dL (ref 30.0–36.0)
MCV: 97.8 fL (ref 78.0–100.0)
MONOS PCT: 6 %
Monocytes Absolute: 0.5 10*3/uL (ref 0.1–1.0)
NEUTROS PCT: 62 %
Neutro Abs: 4.9 10*3/uL (ref 1.7–7.7)
Platelets: 227 10*3/uL (ref 150–400)
RBC: 4.18 MIL/uL (ref 3.87–5.11)
RDW: 13.2 % (ref 11.5–15.5)
WBC: 7.9 10*3/uL (ref 4.0–10.5)

## 2017-08-02 LAB — BASIC METABOLIC PANEL
Anion gap: 8 (ref 5–15)
BUN: 11 mg/dL (ref 6–20)
CHLORIDE: 105 mmol/L (ref 101–111)
CO2: 24 mmol/L (ref 22–32)
Calcium: 8.7 mg/dL — ABNORMAL LOW (ref 8.9–10.3)
Creatinine, Ser: 0.72 mg/dL (ref 0.44–1.00)
GFR calc non Af Amer: >60 mL/min (ref >60)
Glucose, Bld: 98 mg/dL (ref 65–99)
POTASSIUM: 3.8 mmol/L (ref 3.5–5.1)
SODIUM: 137 mmol/L (ref 135–145)

## 2017-08-02 LAB — I-STAT TROPONIN, ED
Troponin i, poc: 0 ng/mL (ref 0.00–0.08)
Troponin i, poc: 0 ng/mL (ref 0.00–0.08)

## 2017-08-02 LAB — D-DIMER, QUANTITATIVE: D-Dimer, Quant: 0.68 ug/mL-FEU — ABNORMAL HIGH (ref 0.00–0.50)

## 2017-08-02 MED ORDER — GUAIFENESIN 100 MG/5ML PO SOLN
5.0000 mL | Freq: Once | ORAL | Status: AC
  Administered 2017-08-02: 100 mg via ORAL
  Filled 2017-08-02: qty 5

## 2017-08-02 MED ORDER — GUAIFENESIN 100 MG/5ML PO LIQD: 100.0000 mg | ORAL | 0 refills | Status: AC | PRN

## 2017-08-02 MED ORDER — IOPAMIDOL (ISOVUE-370) INJECTION 76%
INTRAVENOUS | Status: AC
  Administered 2017-08-02: 100 mL
  Filled 2017-08-02: qty 100

## 2017-08-02 MED ORDER — AMOXICILLIN 500 MG PO CAPS: 500.0000 mg | ORAL_CAPSULE | Freq: Two times a day (BID) | ORAL | 0 refills | Status: AC

## 2017-08-02 MED ORDER — ACETAMINOPHEN 325 MG PO TABS
650.0000 mg | ORAL_TABLET | Freq: Once | ORAL | Status: AC
  Administered 2017-08-02: 650 mg via ORAL
  Filled 2017-08-02: qty 2

## 2017-08-02 NOTE — ED Notes (Signed)
EMS attempted IV access and this RN attempted IV access x1 each without success

## 2017-08-02 NOTE — ED Provider Notes (Signed)
[facility]-EMERGENCY DEPT Provider Note   CSN: [id number] Arrival date & time: 08/02/17  0919     History   Chief Complaint Chief Complaint  Patient presents with  . Chest Pain    HPI Dawn Fletcher is a 61 y.o. female.  HPI  61 year old female presents by EMS with chest pain. Patient is a very difficult historian and jumps from topic to topic often. She has a history of schizophrenia. She mostly complains of bilateral leg pain. When asked about the chest pain, she states his been going on for about a day and a half and feels like both a pressure and also a sharp pain. EMS gave her nitroglycerin twice, first time improve the pain and the second time worsens the pain. Some shortness of breath. Has also been coughing for about a day and a half and is asking for medicine to help with the cough. Has had shortness of breath and right ear pain as well. No nausea, vomiting, abdominal pain. No pleuritic pain. She primary complaints about her left foot hurting but also her left lower leg and right posterior knee. Also transiently had right arm pain but this is gone. She states her chest pain is from "temporary angina" and she had this diagnosed in [place] several years ago. She denies any known heart disease. She also denies hypertension, hyperlipidemia, diabetes, smoking, or drug use. No leg swelling.  Past Medical History:  Diagnosis Date  . Thyroid disease     There are no active problems to display for this patient.   History reviewed. No pertinent surgical history.  OB History    No data available       Home Medications    Prior to Admission medications   Medication Sig Start Date End Date Taking? Authorizing Provider  ferrous sulfate 325 (65 FE) MG tablet Take 325 mg by mouth daily with breakfast.   Yes [provider]  levothyroxine (SYNTHROID, LEVOTHROID) 100 MCG tablet Take 100 mcg by mouth daily before breakfast.   Yes [provider]  naproxen sodium  (ANAPROX) 220 MG tablet Take 440 mg by mouth daily as needed (for pain).   Yes [provider]  risperiDONE (RISPERDAL) 1 MG tablet Take 1 mg by mouth at bedtime.   Yes [provider]  vitamin C (ASCORBIC ACID) 250 MG tablet Take 250 mg by mouth daily.   Yes [provider]  vitamin E 400 UNIT capsule Take 400 Units by mouth daily.   Yes [provider]  amoxicillin (AMOXIL) 500 MG capsule Take 1 capsule (500 mg total) by mouth 2 (two) times daily. 08/02/17 08/12/17  Pricilla Loveless, MD  guaiFENesin (ROBITUSSIN) 100 MG/5ML liquid Take 5-10 mLs (100-200 mg total) by mouth every 4 (four) hours as needed for cough. 08/02/17   Pricilla Loveless, MD    Family History No family history on file.  Social History Social History  Substance Use Topics  . Smoking status: Current Every Day Smoker  . Smokeless tobacco: Never Used  . Alcohol use Not on file     Allergies   Patient has no known allergies.   Review of Systems Review of Systems  Constitutional: Negative for fever.  HENT: Positive for ear pain and sore throat.   Respiratory: Positive for cough and shortness of breath.   Cardiovascular: Positive for chest pain. Negative for leg swelling.  Gastrointestinal: Negative for abdominal pain and vomiting.  Musculoskeletal: Positive for myalgias.  All other systems reviewed and are negative.  Physical Exam Updated Vital Signs BP (!) 111/58   Pulse (!) 55   Temp 98 F (36.7 C) (Oral)   Resp 17   Ht  (1.727 m)   Wt 65.8 kg (145 lb)   SpO2 94%   BMI 22.05 kg/m   Physical Exam  Constitutional: She is oriented to person, place, and time. She appears well-developed and well-nourished. No distress.  HENT:  Head: Normocephalic and atraumatic.  Right Ear: External ear normal.  Left Ear: External ear normal.  Nose: Nose normal.  Right TM covered with white covering, unclear if chronic or acute Left TM obscured by wax  Eyes: Right eye  exhibits no discharge. Left eye exhibits no discharge.  Cardiovascular: Normal rate, regular rhythm and normal heart sounds.   Pulses:      Radial pulses are 2+ on the right side, and 2+ on the left side.       Dorsalis pedis pulses are 2+ on the right side, and 2+ on the left side.  Pulmonary/Chest: Effort normal and breath sounds normal. She has no wheezes. She exhibits tenderness.    Frequent cough  Abdominal: Soft. She exhibits no distension. There is no tenderness.  Musculoskeletal: She exhibits no edema or tenderness.  Left foot with dark toenails diffusely but otherwise no acute abnormalities. No swelling, tenderness or skin changes. No tenderness or swelling to left lower leg/calf. No tenderness or decreased ROM to right knee. Both feet are warm/well perfused  Neurological: She is alert and oriented to person, place, and time.  Skin: Skin is warm and dry. She is not diaphoretic.  Nursing note and vitals reviewed.    ED Treatments / Results  Labs (all labs ordered are listed, but only abnormal results are displayed) Labs Reviewed  BASIC METABOLIC PANEL - Abnormal; Notable for the following:       Result Value   Calcium 8.7 (*)    All other components within normal limits  D-DIMER, QUANTITATIVE (NOT AT [facility]) - Abnormal; Notable for the following:    D-Dimer, Quant 0.68 (*)    All other components within normal limits  CBC WITH DIFFERENTIAL/PLATELET  I-STAT TROPONIN, ED  I-STAT TROPONIN, ED    EKG  EKG Interpretation  Date/Time:  Thursday August 02 2017 13:01:02 EDT Ventricular Rate:  54 PR Interval:    QRS Duration: 80 QT Interval:  519 QTC Calculation: 492 R Axis:   -10 Text Interpretation:  Sinus rhythm Borderline T abnormalities, anterior leads Borderline prolonged QT interval no significant change since earlier in the day Confirmed by Pricilla Loveless [phone]) on 08/02/2017 1:34:03 PM       Radiology Dg Chest 2 View  Result Date: 08/02/2017 CLINICAL DATA:   Acute chest pain today. EXAM: CHEST  2 VIEW COMPARISON:  None. FINDINGS: Upper limits normal heart size noted. There is no evidence of focal airspace disease, pulmonary edema, suspicious pulmonary nodule/mass, pleural effusion, or pneumothorax. No acute bony abnormalities are identified. IMPRESSION: Upper limits normal heart size without evidence of acute cardiopulmonary disease. Electronically Signed   By: Harmon Pier M.D.   On: 08/02/2017 10:55   Ct Angio Chest Pe W/cm &/or Wo Cm  Result Date: 08/02/2017 CLINICAL DATA:  Severe acute chest pain and shortness of breath with cough since this morning EXAM: CT ANGIOGRAPHY CHEST WITH CONTRAST TECHNIQUE: Multidetector CT imaging of the chest was performed using the standard protocol during bolus administration of intravenous contrast. Multiplanar CT image reconstructions and MIPs were obtained to evaluate the  vascular anatomy. CONTRAST:  100 cc Isovue 370 COMPARISON:  08/02/2017 FINDINGS: Cardiovascular: Pulmonary arteries are well visualized. No significant filling defect or pulmonary embolus by CTA. Atherosclerosis of thoracic aorta. No dissection or mediastinal hemorrhage. Major branch vessels are patent. Mild aneurysmal dilatation of the ascending thoracic aorta, maximal diameter 4.1 cm. Normal heart size. No pericardial effusion. Native coronary atherosclerosis evident. Mediastinum/Nodes: No enlarged mediastinal, hilar, or axillary lymph nodes. Thyroid gland, trachea, and esophagus demonstrate no significant findings. Lungs/Pleura: Mild diffuse patchy ground-glass attenuation bilaterally, nonspecific but can be seen with small airways disease or mild alveolitis. No focal pneumonia, collapse or consolidation. No interstitial process or edema. No pleural abnormality, effusion, or pneumothorax. Upper Abdomen: Calcified gallstones noted. Abdominal atherosclerosis present. No acute upper abdominal finding. Musculoskeletal: No acute osseous finding. No compression  fracture. Sternum intact. Review of the MIP images confirms the above findings. IMPRESSION: No significant pulmonary embolus by CTA. Aortic atherosclerosis and mild aneurysmal dilatation of the ascending thoracic aorta. Maximal diameter 4.1 cm. Recommend semi-annual imaging followup by CTA or MRA and referral to cardiothoracic surgery if not already obtained. This recommendation follows 2010 ACCF/AHA/AATS/ACR/ASA/SCA/SCAI/SIR/STS/SVM Guidelines for the Diagnosis and Management of Patients With Thoracic Aortic Disease. Circulation. 2010; 121: e266-e36 Mild nonspecific patchy ground-glass attenuation bilaterally suspicious for chronic small airways disease versus mild alveolitis, remain nonspecific. No other acute intrathoracic process or finding. Cholelithiasis Aortic Atherosclerosis (ICD10-I70.0). Electronically Signed   By: Judie Petit.  Shick M.D.   On: 08/02/2017 13:06   Dg Foot Complete Left  Result Date: 08/02/2017 CLINICAL DATA:  Left foot pain.  Initial encounter. EXAM: LEFT FOOT - COMPLETE 3+ VIEW COMPARISON:  None. FINDINGS: There is no evidence of fracture or dislocation. There is no evidence of arthropathy or other focal bone abnormality. Soft tissues are unremarkable. IMPRESSION: Negative. Electronically Signed   By: Harmon Pier M.D.   On: 08/02/2017 10:55    Procedures Procedures (including critical care time)  Medications Ordered in ED Medications  acetaminophen (TYLENOL) tablet 650 mg (650 mg Oral Given 08/02/17 1016)  guaiFENesin (ROBITUSSIN) 100 MG/5ML solution 100 mg (100 mg Oral Given 08/02/17 1048)  iopamidol (ISOVUE-370) 76 % injection (100 mLs  Contrast Given 08/02/17 1222)     Initial Impression / Assessment and Plan / ED Course  I have reviewed the triage vital signs and the nursing notes.  Pertinent labs & imaging results that were available during my care of the patient were reviewed by me and considered in my medical decision making (see chart for details).     Patient's  chest pain is atypical and reproducible. Her other symptoms are consistent with an upper respiratory infection, which is likely causing the chest pain. She has 2 negative troponins and a negative workup for PE. There is no clear pneumonia. Unclear what I was seeing on her right eardrum but given the acute pain over the last couple days in association with other infectious symptoms I will cover for otitis media. Have her follow-up with ENT. No clear cause for her bilateral lower extremity symptoms but there are no focal findings on exam. She appears stable for discharge home with return precautions.  Final Clinical Impressions(s) / ED Diagnoses   Final diagnoses:  Upper respiratory tract infection, unspecified type  Chest wall pain  Right otitis media, unspecified otitis media type    New Prescriptions Discharge Medication List as of 08/02/2017  2:28 PM    START taking these medications   Details  amoxicillin (AMOXIL) 500 MG capsule Take 1 capsule (  500 mg total) by mouth 2 (two) times daily., Starting Thu 08/02/2017, Until Sun 08/12/2017, Print    guaiFENesin (ROBITUSSIN) 100 MG/5ML liquid Take 5-10 mLs (100-200 mg total) by mouth every 4 (four) hours as needed for cough., Starting Thu 08/02/2017, Print         Pricilla Loveless, MD 08/02/17 1710

## 2017-08-02 NOTE — ED Triage Notes (Signed)
Per EMS pt picked up from [employer]- she was having sharp central chest pain rated 8/10 ongoing for about 2 hours. EMS given 324 ASA and with given 2 nitroglycerin SL. Pt pain decreased from 8/10 to 2/10 after first administration of nitroglycerin. After second administration of nitro patient states pain increased to 6/10. Pt presently denies cp. Pt has no associated symptoms. Pt sts she feels she has three separate pains in her central chest, right arm and back but does not feel like radiation. Pt also intermittently speaking softly during EMS assessment about various topics but able to answer direct questioning appropriately.

## 2017-08-03 ENCOUNTER — Encounter (HOSPITAL_COMMUNITY): Payer: Self-pay | Admitting: Emergency Medicine

## 2017-09-30 ENCOUNTER — Emergency Department (HOSPITAL_COMMUNITY): Payer: Self-pay

## 2017-09-30 ENCOUNTER — Other Ambulatory Visit: Payer: Self-pay

## 2017-09-30 ENCOUNTER — Encounter (HOSPITAL_COMMUNITY): Payer: Self-pay

## 2017-09-30 ENCOUNTER — Emergency Department (HOSPITAL_COMMUNITY)
Admission: EM | Admit: 2017-09-30 | Discharge: 2017-09-30 | Disposition: A | Discharge status: Home / Self Care | Payer: Self-pay | Attending: Emergency Medicine | Admitting: Emergency Medicine

## 2017-09-30 DIAGNOSIS — R519 Headache, unspecified: Secondary | ICD-10-CM

## 2017-09-30 DIAGNOSIS — F17210 Nicotine dependence, cigarettes, uncomplicated: Secondary | ICD-10-CM | POA: Insufficient documentation

## 2017-09-30 DIAGNOSIS — Z590 Homelessness: Secondary | ICD-10-CM | POA: Insufficient documentation

## 2017-09-30 DIAGNOSIS — R51 Headache: Secondary | ICD-10-CM | POA: Insufficient documentation

## 2017-09-30 DIAGNOSIS — Z79899 Other long term (current) drug therapy: Secondary | ICD-10-CM | POA: Insufficient documentation

## 2017-09-30 DIAGNOSIS — R062 Wheezing: Secondary | ICD-10-CM | POA: Insufficient documentation

## 2017-09-30 DIAGNOSIS — R0789 Other chest pain: Secondary | ICD-10-CM | POA: Insufficient documentation

## 2017-09-30 LAB — BASIC METABOLIC PANEL
ANION GAP: 8 (ref 5–15)
BUN: 11 mg/dL (ref 6–20)
CHLORIDE: 105 mmol/L (ref 101–111)
CO2: 23 mmol/L (ref 22–32)
CREATININE: 0.63 mg/dL (ref 0.44–1.00)
Calcium: 8.9 mg/dL (ref 8.9–10.3)
GFR calc non Af Amer: >60 mL/min (ref >60)
GLUCOSE: 97 mg/dL (ref 65–99)
Potassium: 4.8 mmol/L (ref 3.5–5.1)
Sodium: 136 mmol/L (ref 135–145)

## 2017-09-30 LAB — CBC
HCT: 46.8 % — ABNORMAL HIGH (ref 36.0–46.0)
HEMOGLOBIN: 15.9 g/dL — AB (ref 12.0–15.0)
MCH: 33.6 pg (ref 26.0–34.0)
MCHC: 34 g/dL (ref 30.0–36.0)
MCV: 98.9 fL (ref 78.0–100.0)
Platelets: 268 10*3/uL (ref 150–400)
RBC: 4.73 MIL/uL (ref 3.87–5.11)
RDW: 12.8 % (ref 11.5–15.5)
WBC: 8.4 10*3/uL (ref 4.0–10.5)

## 2017-09-30 LAB — I-STAT TROPONIN, ED
TROPONIN I, POC: 0 ng/mL (ref 0.00–0.08)
TROPONIN I, POC: 0 ng/mL (ref 0.00–0.08)

## 2017-09-30 MED ORDER — IPRATROPIUM-ALBUTEROL 0.5-2.5 (3) MG/3ML IN SOLN
3.0000 mL | Freq: Once | RESPIRATORY_TRACT | Status: AC
  Administered 2017-09-30: 3 mL via RESPIRATORY_TRACT
  Filled 2017-09-30: qty 3

## 2017-09-30 MED ORDER — DIPHENHYDRAMINE HCL 50 MG/ML IJ SOLN
25.0000 mg | Freq: Once | INTRAMUSCULAR | Status: AC
  Administered 2017-09-30: 25 mg via INTRAVENOUS
  Filled 2017-09-30: qty 1

## 2017-09-30 MED ORDER — KETOROLAC TROMETHAMINE 15 MG/ML IJ SOLN
15.0000 mg | Freq: Once | INTRAMUSCULAR | Status: AC
Start: 2017-09-30 — End: 2017-09-30
  Administered 2017-09-30: 15 mg via INTRAVENOUS
  Filled 2017-09-30: qty 1

## 2017-09-30 MED ORDER — SODIUM CHLORIDE 0.9 % IV BOLUS (SEPSIS)
1000.0000 mL | Freq: Once | INTRAVENOUS | Status: AC
  Administered 2017-09-30: 1000 mL via INTRAVENOUS

## 2017-09-30 MED ORDER — PROCHLORPERAZINE EDISYLATE 5 MG/ML IJ SOLN
10.0000 mg | Freq: Once | INTRAMUSCULAR | Status: AC
  Administered 2017-09-30: 10 mg via INTRAVENOUS
  Filled 2017-09-30: qty 2

## 2017-09-30 NOTE — ED Provider Notes (Signed)
Medical screening examination/treatment/procedure(s) were conducted as a shared visit with non-physician practitioner(s) and myself.  I personally evaluated the patient during the encounter. Briefly, the patient is a 61 yo F with h/o schizophrenia here with HA, atypical chest pain. [facility] when she developed temporal HA and intermittent atypical chest pressure. EKG on arrival NSR. Initial labs including troponin I negative. No SOB. No leg swelling. No abdominal sx.   Regarding her HA, it is bitemporal, similar to her chronic HA. No neuro deficits. No blood thinner use. No fever or chills. No red flags. Will tx symptomatically.  Regarding her CP, this is also chronic. No changes from her chronic pain other than the fact that it is "jumping around" her body. HEART score <3. Will delta trop. Pt denies any change from her chronic CP, no leg swelling, no s/s DVT and she just had neg PE study one month ago for this - do not feel PE is likely. Will tx supportively.    EKG Interpretation  Date/Time:  Sunday September 30 2017 14:21:33 EST Ventricular Rate:  60 PR Interval:  154 QRS Duration: 104 QT Interval:  422 QTC Calculation: 422 R Axis:   -12 Text Interpretation:  Normal sinus rhythm Borderline ECG No significant change since last tracing Confirmed by Shaune PollackIsaacs, Kionte Baumgardner [phone]([id number]) on 09/30/2017 2:23:51 PM Also confirmed by Shaune PollackIsaacs, Anhthu Perdew [phone]), editor Madalyn RobEverhart, Marilyn [phone])  on 09/30/2017 3:03:38 PM          Shaune PollackIsaacs, Dara Beidleman, MD 09/30/17 1537

## 2017-09-30 NOTE — ED Notes (Signed)
Provider at the bedside.  

## 2017-09-30 NOTE — ED Notes (Signed)
Pt taken to Xray.

## 2017-09-30 NOTE — ED Notes (Signed)
Pt returned from X-ray.  

## 2017-09-30 NOTE — ED Provider Notes (Signed)
I assumed patient's care shift change. Please see note by Shirlyn GoltzEmily Shrosbree PA-C for full history and physical.  Briefly patient is here for headache and chest pain.    Plan at this time is to follow-up on delta troponin, if normal then discharged home with PCP follow-up.  Delta troponin normal.  I introduced myself to the patient, she was resting comfortably in no obvious distress.  She has multiple cosmetics bottles nail polish bottles on her table in the room and appears to have pain and pain to her nails while being here.  She denies any complaints at this time.  She was given return precautions, and states her understanding.  She was ambulatory in the hallways without obvious distress.  She was given the option to questions, all of which were answered to the best of my ability.   Dawn Fletcher, Dawn Gad W, PA-C 09/30/17 Dawn Goltz1953    Dawn PollackIsaacs, Cameron, MD 10/01/17 1128

## 2017-09-30 NOTE — ED Provider Notes (Signed)
[facility] EMERGENCY DEPARTMENT Provider Note   CSN: [id number] Arrival date & time: 09/30/17  1417     History   Chief Complaint Chief Complaint  Patient presents with  . Headache  . Chest Pain    HPI Dawn Fletcher is a 61 y.o. female.  HPI   Dawn Fletcher is a 61yo female with a history of paranoid schizophrenia, tobacco use who presents to the Emergency Department for evaluation of chest pain and headache. Patient is a difficult historian, jumps from topic to topic. She states that she was at [facility] about two hours ago when she developed an "angina attack." Reports that she has these attacks about once a month and states that this attack is no different from previous. Patient describes sensation of pressure in her mid chest which radiates to her bilateral temples, arms, legs. Chest pain has been persistent, is an 8/10 in severity. Worsened with coughing, walking or with taking a deep breath. Denies associated SOB, N/V, lightheadedness. She denies history of diabetes, HTN, hyperlipidemia. Denies history of exertional CP. Denies family history of MI. She also reports left leg pain, which has been constant for several weeks now. Denies history of DVT/PE, hemoptysis, recent surgeries, exogenous estrogen use. She states that her bitemporal pain is "stabbing" in nature and constant. She reports taking aleve without improvement. Denies visual disturbance, numbness, weakness, dysarthria, dysphagia, fever, neck pain, abdominal pain, cough, dysuria. States that she gets this pain "every now and again" and is asking for coffee.  Patient is homeless, states that her primary doctor is Dr. Alger Simons.  Past Medical History:  Diagnosis Date  . Paranoid schizophrenia (HCC)   . Thyroid disease     There are no active problems to display for this patient.   Past Surgical History:  Procedure Laterality Date  . ABDOMINAL HYSTERECTOMY      OB History    No data available        Home Medications    Prior to Admission medications   Medication Sig Start Date End Date Taking? Authorizing Provider  acetaminophen (TYLENOL) 500 MG tablet Take 500 mg by mouth every 6 (six) hours as needed (pain).    [provider]  ferrous sulfate 325 (65 FE) MG tablet Take 325 mg by mouth daily with breakfast.    [provider]  guaiFENesin (ROBITUSSIN) 100 MG/5ML liquid Take 5-10 mLs (100-200 mg total) by mouth every 4 (four) hours as needed for cough. 08/02/17   Pricilla Loveless, MD  levothyroxine (SYNTHROID, LEVOTHROID) 100 MCG tablet Take 100 mcg by mouth daily before breakfast.    [provider]  Levothyroxine Sodium (SYNTHROID PO) Take by mouth. Pt unsure of where she got it filled.    [provider]  naproxen sodium (ANAPROX) 220 MG tablet Take 440 mg by mouth daily as needed (for pain).    [provider]  risperiDONE (RISPERDAL) 1 MG tablet Take 1 mg by mouth at bedtime.     [provider]  risperiDONE (RISPERDAL) 1 MG tablet Take 1 mg by mouth at bedtime.    [provider]  vitamin C (ASCORBIC ACID) 250 MG tablet Take 250 mg by mouth daily.    [provider]  vitamin E 400 UNIT capsule Take 400 Units by mouth daily.    [provider]    Family History No family history on file.  Social History Social History   Tobacco Use  . Smoking status: Current Every Day  Smoker    Packs/day: 0.50    Types: Cigarettes  . Smokeless tobacco: Never Used  Substance Use Topics  . Alcohol use: No  . Drug use: No     Allergies   Patient has no known allergies.   Review of Systems Review of Systems  Constitutional: Negative for chills, fatigue and fever.  Eyes: Negative for visual disturbance.  Respiratory: Negative for cough, shortness of breath and wheezing.   Cardiovascular: Positive for chest pain.  Gastrointestinal: Negative for abdominal pain, nausea and vomiting.  Genitourinary:  Negative for difficulty urinating.  Musculoskeletal: Positive for arthralgias (bilateral hands, legs). Negative for gait problem.  Skin: Negative for rash and wound.  Neurological: Positive for headaches. Negative for dizziness, weakness, light-headedness and numbness.  Psychiatric/Behavioral: Negative for agitation.     Physical Exam Updated Vital Signs BP (!) 141/95 (BP Location: Right Arm)   Pulse 60   Temp 97.8 F (36.6 C) (Oral)   Resp 17   Ht 5' 6.5" (1.689 m)   Wt 65.8 kg (145 lb)   SpO2 100%   BMI 23.05 kg/m   Physical Exam  Constitutional: She is oriented to person, place, and time. She appears well-developed and well-nourished.  Non-toxic appearance. She does not appear ill. No distress.  HENT:  Head: Normocephalic and atraumatic.  Mouth/Throat: Oropharynx is clear and moist.  Eyes: EOM are normal. Pupils are equal, round, and reactive to light. Right eye exhibits no discharge. Left eye exhibits no discharge. Right eye exhibits no nystagmus. Left eye exhibits no nystagmus.  Neck: Normal range of motion. Neck supple. No JVD present. No tracheal deviation present. No Brudzinski's sign noted.  Cardiovascular: Normal rate, regular rhythm and intact distal pulses.  No murmur heard. Pulmonary/Chest: Effort normal and breath sounds normal. No stridor. No respiratory distress.  Good air movement. Faint expiratory wheezes in bilateral lower lung fields.   Abdominal: Soft. Bowel sounds are normal. She exhibits no distension. There is no tenderness. There is no guarding.  Musculoskeletal: Normal range of motion.  No leg swelling, erythema or tenderness. DP pulses 2+ bilaterally.   Lymphadenopathy:    She has no cervical adenopathy.  Neurological: She is alert and oriented to person, place, and time. Coordination normal.  Mental Status:  Alert, oriented, thought content appropriate. Speech fluent without evidence of aphasia. Able to follow 2 step commands without difficulty.    Cranial Nerves:  II:  Peripheral visual fields grossly normal, pupils equal, round, reactive to light III,IV, VI: ptosis not present, extra-ocular motions intact bilaterally  V,VII: smile symmetric, facial light touch sensation equal VIII: hearing grossly normal to voice  X: uvula elevates symmetrically  XI: bilateral shoulder shrug symmetric and strong XII: midline tongue extension without fassiculations Motor:  Normal tone. 5/5 in upper and lower extremities bilaterally including strong and equal grip strength and dorsiflexion/plantar flexion Sensory: Pinprick and light touch normal in all extremities.  Deep Tendon Reflexes: 2+ and symmetric in the biceps and patella Cerebellar: normal finger-to-nose with bilateral upper extremities Gait: normal gait and balance  Skin: Skin is warm and dry. Capillary refill takes less than 2 seconds. She is not diaphoretic. No cyanosis.  Psychiatric: She has a normal mood and affect. Her behavior is normal.  Patient appears unkempt. Voice appropriate speed, volume. Good articulation. No apparent hallucinations or delusions. Patient denies SI/HI.   Nursing note and vitals reviewed.    ED Treatments / Results  Labs (all labs ordered are listed, but only abnormal results are displayed)  Labs Reviewed  CBC - Abnormal; Notable for the following components:      Result Value   Hemoglobin 15.9 (*)    HCT 46.8 (*)    All other components within normal limits  BASIC METABOLIC PANEL  I-STAT TROPONIN, ED    EKG  EKG Interpretation  Date/Time:  Sunday September 30 2017 14:21:33 EST Ventricular Rate:  60 PR Interval:  154 QRS Duration: 104 QT Interval:  422 QTC Calculation: 422 R Axis:   -12 Text Interpretation:  Normal sinus rhythm Borderline ECG No significant change since last tracing Confirmed by Shaune PollackIsaacs, Cameron [phone]) on 09/30/2017 2:23:51 PM Also confirmed by Shaune PollackIsaacs, Cameron [phone]), editor Madalyn RobEverhart, Marilyn [phone]([id number])  on 09/30/2017 3:03:38 PM        Radiology Dg Chest 2 View  Result Date: 09/30/2017 CLINICAL DATA:  Chest pain EXAM: CHEST  2 VIEW COMPARISON:  October 13, 2013 FINDINGS: There is no edema or consolidation. The heart size and pulmonary vascularity are normal. No adenopathy. There is aortic atherosclerosis. No evident bone lesions. IMPRESSION: Aortic atherosclerosis.  No edema or consolidation. Aortic Atherosclerosis (ICD10-I70.0). Electronically Signed   By: Bretta BangWilliam  Woodruff III M.D.   On: 09/30/2017 14:55    Procedures Procedures (including critical care time)  Medications Ordered in ED Medications - No data to display   Initial Impression / Assessment and Plan / ED Course  I have reviewed the triage vital signs and the nursing notes.  Pertinent labs & imaging results that were available during my care of the patient were reviewed by me and considered in my medical decision making (see chart for details).  Clinical Course as of Sep 30 1649  Wynelle LinkSun Sep 30, 2017  1621 Patient witnessed washing her hair and painting her fingernails  [ES]  1622 On recheck she he states "my head feels wonderful, I feel really good."  [ES]    Clinical Course User Index [ES] Kellie ShropshireShrosbree, Elena Cothern J, PA-C    Patient presents with several complaints.   Headache Pts headache treated and improved while she was in the ED. She is afebrile with no focal neuro deficits, nuchal rigidity, or change in vision. She reports that this is similar to previous headaches and presentation is non concerning for [facility], ICH, meningitis or temporal arteritis. Will treat with fluids, toradol, compazine and benadryl.   Chest pain Patient has history of what she calls "angina attacks" which occur about once a month. She reports that this is no different from previous attacks. BMP WNL, CBC with Hgb 15.9, suspect hemoconcentrated. Fluids running. Initial troponin 0.0, EKG non-ischemic and CXR without acute abnormality. Patient's heart score is 3, will delta  troponin. Do not suspect PE. Patient's left leg pain has been constant since her last ED visit when she had similar symptoms and a full PE workup which was negative.  She is not tachypneic or tachycardic.  No appreciable leg swelling on exam.  No history of DVT or PE.  No recent surgeries or exogenous estrogen use.  Discussed with Dr. Erma HeritageIsaacs who also saw the patient and agrees with this plan.   Sign out given to Lyndel SafeElizabeth Hammond for disposition following delta troponin. If negative, patient can be discharged home and counseled to follow up with primary care doctor regarding today's ER visit.  Final Clinical Impressions(s) / ED Diagnoses   Final diagnoses:  None    ED Discharge Orders    None       Kellie ShropshireShrosbree, Coren Crownover J, PA-C 10/04/17 1003  Shaune Pollack, MD 10/05/17 [phone]

## 2017-09-30 NOTE — Discharge Instructions (Addendum)
Please follow up with your doctor regarding your ED visit and symptoms today.   °

## 2017-09-30 NOTE — ED Triage Notes (Signed)
Per Pt, Pt is coming from [employer] where she is having temporal headache and intermittent chest pressure that started two hours ago. Vitals per EMS: 170/100, 81 HR, 98% on RA, 18 RR. Hx of Thyroid Irregularities.

## 2017-10-01 ENCOUNTER — Other Ambulatory Visit: Payer: Self-pay

## 2017-10-01 ENCOUNTER — Emergency Department (HOSPITAL_COMMUNITY)
Admission: EM | Admit: 2017-10-01 | Discharge: 2017-10-01 | Disposition: A | Discharge status: Home / Self Care | Payer: Self-pay | Attending: Emergency Medicine | Admitting: Emergency Medicine

## 2017-10-01 ENCOUNTER — Encounter (HOSPITAL_COMMUNITY): Payer: Self-pay | Admitting: Emergency Medicine

## 2017-10-01 DIAGNOSIS — R4587 Impulsiveness: Secondary | ICD-10-CM

## 2017-10-01 DIAGNOSIS — F17210 Nicotine dependence, cigarettes, uncomplicated: Secondary | ICD-10-CM | POA: Insufficient documentation

## 2017-10-01 DIAGNOSIS — Z79899 Other long term (current) drug therapy: Secondary | ICD-10-CM | POA: Insufficient documentation

## 2017-10-01 DIAGNOSIS — F200 Paranoid schizophrenia: Secondary | ICD-10-CM | POA: Insufficient documentation

## 2017-10-01 DIAGNOSIS — R45851 Suicidal ideations: Secondary | ICD-10-CM | POA: Insufficient documentation

## 2017-10-01 LAB — CBC
HCT: 43.2 % (ref 36.0–46.0)
Hemoglobin: 15 g/dL (ref 12.0–15.0)
MCH: 33.8 pg (ref 26.0–34.0)
MCHC: 34.7 g/dL (ref 30.0–36.0)
MCV: 97.3 fL (ref 78.0–100.0)
PLATELETS: 260 10*3/uL (ref 150–400)
RBC: 4.44 MIL/uL (ref 3.87–5.11)
RDW: 12.7 % (ref 11.5–15.5)
WBC: 7.7 10*3/uL (ref 4.0–10.5)

## 2017-10-01 LAB — COMPREHENSIVE METABOLIC PANEL
ALBUMIN: 4.1 g/dL (ref 3.5–5.0)
ALT: 14 U/L (ref 14–54)
AST: 19 U/L (ref 15–41)
Alkaline Phosphatase: 78 U/L (ref 38–126)
Anion gap: 9 (ref 5–15)
BILIRUBIN TOTAL: 0.6 mg/dL (ref 0.3–1.2)
BUN: 15 mg/dL (ref 6–20)
CHLORIDE: 107 mmol/L (ref 101–111)
CO2: 22 mmol/L (ref 22–32)
Calcium: 8.9 mg/dL (ref 8.9–10.3)
Creatinine, Ser: 0.67 mg/dL (ref 0.44–1.00)
GFR calc Af Amer: >60 mL/min (ref >60)
GFR calc non Af Amer: >60 mL/min (ref >60)
GLUCOSE: 111 mg/dL — AB (ref 65–99)
POTASSIUM: 3.9 mmol/L (ref 3.5–5.1)
Sodium: 138 mmol/L (ref 135–145)
TOTAL PROTEIN: 7.3 g/dL (ref 6.5–8.1)

## 2017-10-01 LAB — RAPID URINE DRUG SCREEN, HOSP PERFORMED
AMPHETAMINES: NOT DETECTED
BENZODIAZEPINES: NOT DETECTED
Barbiturates: NOT DETECTED
COCAINE: NOT DETECTED
OPIATES: NOT DETECTED
Tetrahydrocannabinol: NOT DETECTED

## 2017-10-01 LAB — ETHANOL: Alcohol, Ethyl (B): <10 mg/dL (ref <10)

## 2017-10-01 LAB — ACETAMINOPHEN LEVEL

## 2017-10-01 LAB — SALICYLATE LEVEL: Salicylate Lvl: <7 mg/dL (ref 2.8–30.0)

## 2017-10-01 MED ORDER — LEVOTHYROXINE SODIUM 100 MCG PO TABS
100.0000 ug | ORAL_TABLET | Freq: Every day | ORAL | Status: DC
  Administered 2017-10-01: 100 ug via ORAL
  Filled 2017-10-01 (×2): qty 1

## 2017-10-01 MED ORDER — ONDANSETRON HCL 4 MG PO TABS: 4.0000 mg | ORAL_TABLET | Freq: Three times a day (TID) | ORAL | Status: DC | PRN

## 2017-10-01 MED ORDER — ALUM & MAG HYDROXIDE-SIMETH 200-200-20 MG/5ML PO SUSP: 30.0000 mL | Freq: Four times a day (QID) | ORAL | Status: DC | PRN

## 2017-10-01 MED ORDER — RISPERIDONE 1 MG PO TABS
1.0000 mg | ORAL_TABLET | Freq: Every day | ORAL | Status: DC
  Administered 2017-10-01: 1 mg via ORAL
  Filled 2017-10-01: qty 1

## 2017-10-01 MED ORDER — NICOTINE 14 MG/24HR TD PT24
14.0000 mg | MEDICATED_PATCH | Freq: Every day | TRANSDERMAL | Status: DC
  Administered 2017-10-01: 14 mg via TRANSDERMAL
  Filled 2017-10-01 (×2): qty 1

## 2017-10-01 MED ORDER — VITAMIN C 250 MG PO TABS
250.0000 mg | ORAL_TABLET | Freq: Every day | ORAL | Status: DC
  Administered 2017-10-01: 250 mg via ORAL
  Filled 2017-10-01: qty 1

## 2017-10-01 MED ORDER — FERROUS SULFATE 325 (65 FE) MG PO TABS
325.0000 mg | ORAL_TABLET | Freq: Every day | ORAL | Status: DC
  Administered 2017-10-01: 325 mg via ORAL
  Filled 2017-10-01 (×2): qty 1

## 2017-10-01 MED ORDER — VITAMIN E 180 MG (400 UNIT) PO CAPS
400.0000 [IU] | ORAL_CAPSULE | Freq: Every day | ORAL | Status: DC
  Administered 2017-10-01: 400 [IU] via ORAL
  Filled 2017-10-01: qty 1

## 2017-10-01 MED ORDER — ACETAMINOPHEN 325 MG PO TABS
650.0000 mg | ORAL_TABLET | ORAL | Status: DC | PRN
  Administered 2017-10-01: 650 mg via ORAL
  Filled 2017-10-01: qty 2

## 2017-10-01 NOTE — ED Notes (Signed)
Pt currently sleeping. Will obtain vital signs when pt awake.

## 2017-10-01 NOTE — [facility] Suicide Risk Assessment (Signed)
Suicide Risk Assessment  Discharge Assessment   [facility]   Principal Problem: Schizophrenia, paranoid [facility](HCC) Discharge Diagnoses:  Patient Active Problem List   Diagnosis Date Noted  . Schizophrenia, paranoid (HCC) [F20.0] 10/01/2017    Total Time spent with patient: 45 minutes  Musculoskeletal: Strength & Muscle Tone: within normal limits Gait & Station: normal Patient leans: N/A  Psychiatric Specialty Exam:   Blood pressure 129/76, pulse 80, temperature 97.6 F (36.4 C), temperature source Oral, resp. rate 18, height 5' 6.5" (1.689 m), weight 65.8 kg (145 lb), SpO2 98 %.Body mass index is 23.05 kg/m.  General Appearance: Disheveled  Eye Contact::  Good  Speech:  Clear and Coherent409  Volume:  Normal  Mood:  Anxious  Affect:  Congruent  Thought Process:  Coherent, Goal Directed and Linear  Orientation:  Full (Time, Place, and Person)  Thought Content:  Logical  Suicidal Thoughts:  No  Homicidal Thoughts:  No  Memory:  Immediate;   Good Recent;   Good Remote;   Poor  Judgement:  Fair  Insight:  Fair  Psychomotor Activity:  Normal  Concentration:  Good  Recall:  Good  Fund of Knowledge:Good  Language: Good  Akathisia:  No  Handed:  Right  AIMS (if indicated):     Assets:  ArchitectCommunication Skills Financial Resources/Insurance Housing Social Support  Sleep:     Cognition: WNL  ADL's:  Intact   Mental Status Per Nursing Assessment::   On Admission:   Confused  Demographic Factors:  Caucasian and Low socioeconomic status  Loss Factors: Financial problems/change in socioeconomic status  Historical Factors: Impulsivity  Risk Reduction Factors:   Sense of responsibility to family  Continued Clinical Symptoms:  Schizophrenia:   Paranoid or undifferentiated type  Cognitive Features That Contribute To Risk:  Closed-mindedness    Suicide Risk:  Minimal: No identifiable suicidal ideation.  Patients presenting with no risk  factors but with morbid ruminations; may be classified as minimal risk based on the severity of the depressive symptoms    Plan Of Care/Follow-up recommendations:  Activity:  as tolerated Diet:  Heart Healthy  Laveda AbbeLaurie Britton Reuven Braver, NP 10/01/2017, 11:17 AM

## 2017-10-01 NOTE — ED Notes (Signed)
Bed: WA28 Expected date:  Expected time:  Means of arrival:  Comments: 
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
## 2017-10-01 NOTE — ED Provider Notes (Signed)
[facility]-EMERGENC[facility] DEPT Provider Note   CSN: [id number] Arrival date & time: 10/01/17  0041     History   Chief Complaint Chief Complaint  Patient presents with  . Medical Clearance    HPI Dawn Fletcher is a 61 y.o. female.  The history is provided by the patient.  She has a history of paranoid schizophrenia and states that she has had suicidal thoughts for the last week.  She had thoughts of taking all of her Risperdal tablets and not waking up.  She also states she has been having some paranoid thoughts, but these are more that people are looking at her rather than actual thoughts of people doing harm to her.  She denies visual or auditory hallucinations.  She claims medication compliance.  She denies drug or alcohol use.  Past Medical History:  Diagnosis Date  . Paranoid schizophrenia (HCC)   . Thyroid disease     There are no active problems to display for this patient.   Past Surgical History:  Procedure Laterality Date  . ABDOMINAL HYSTERECTOMY      OB History    No data available       Home Medications    Prior to Admission medications   Medication Sig Start Date End Date Taking? Authorizing Provider  acetaminophen (TYLENOL) 500 MG tablet Take 500 mg by mouth every 6 (six) hours as needed (pain).    [provider]  ferrous sulfate 325 (65 FE) MG tablet Take 325 mg by mouth daily with breakfast.    [provider]  guaiFENesin (ROBITUSSIN) 100 MG/5ML liquid Take 5-10 mLs (100-200 mg total) by mouth every 4 (four) hours as needed for cough. 08/02/17   Pricilla LovelessGoldston, Scott, MD  levothyroxine (SYNTHROID, LEVOTHROID) 100 MCG tablet Take 100 mcg by mouth daily before breakfast.    [provider]  Levothyroxine Sodium (SYNTHROID PO) Take by mouth. Pt unsure of where she got it filled.    [provider]  naproxen sodium (ANAPROX) 220 MG tablet Take 440 mg by mouth daily as needed (for pain).    [provider]  risperiDONE (RISPERDAL) 1 MG tablet Take 1 mg by mouth at bedtime.     [provider]  risperiDONE (RISPERDAL) 1 MG tablet Take 1 mg by mouth at bedtime.    [provider]  vitamin C (ASCORBIC ACID) 250 MG tablet Take 250 mg by mouth daily.    [provider]  vitamin E 400 UNIT capsule Take 400 Units by mouth daily.    [provider]    Family History No family history on file.  Social History Social History   Tobacco Use  . Smoking status: Current Every Day Smoker    Packs/day: 0.50    Types: Cigarettes  . Smokeless tobacco: Never Used  Substance Use Topics  . Alcohol use: No  . Drug use: No     Allergies   Patient has no known allergies.   Review of Systems Review of Systems  All other systems reviewed and are negative.    Physical Exam Updated Vital Signs BP 126/75 (BP Location: Right Arm)   Pulse 71   Temp 97.6 F (36.4 C) (Oral)   Resp 18   Ht 5' 6.5" (1.689 m)   Wt 65.8 kg (145 lb)   SpO2 100%   BMI 23.05 kg/m   Physical Exam  Nursing note and vitals reviewed.  61 year old female, resting comfortably and in  no acute distress. Vital signs are normal. Oxygen saturation is 100%, which is normal. Head is normocephalic and atraumatic. PERRLA, EOMI. Oropharynx is clear. Neck is nontender and supple without adenopathy or JVD. Back is nontender and there is no CVA tenderness. Lungs are clear without rales, wheezes, or rhonchi. Chest is nontender. Heart has regular rate and rhythm without murmur. Abdomen is soft, flat, nontender without masses or hepatosplenomegaly and peristalsis is normoactive. Extremities have no cyanosis or edema, full range of motion is present. Skin is warm and dry without rash. Neurologic: Mental status is normal, cranial nerves are intact, there are no motor or sensory deficits. Psychiatric: She is bright and alert and really shows no evidence of depression whatsoever.  She  appears extremely upbeat.  She does not appear to be reacting to any internal stimuli.  ED Treatments / Results  Labs (all labs ordered are listed, but only abnormal results are displayed) Labs Reviewed  COMPREHENSIVE METABOLIC PANEL - Abnormal; Notable for the following components:      Result Value   Glucose, Bld 111 (*)    All other components within normal limits  ACETAMINOPHEN LEVEL - Abnormal; Notable for the following components:   Acetaminophen (Tylenol), Serum <10 (*)    All other components within normal limits  ETHANOL  SALICYLATE LEVEL  CBC  RAPID URINE DRUG SCREEN, HOSP PERFORMED    Radiology Dg Chest 2 View  Result Date: 09/30/2017 CLINICAL DATA:  Chest pain EXAM: CHEST  2 VIEW COMPARISON:  October 13, 2013 FINDINGS: There is no edema or consolidation. The heart size and pulmonary vascularity are normal. No adenopathy. There is aortic atherosclerosis. No evident bone lesions. IMPRESSION: Aortic atherosclerosis.  No edema or consolidation. Aortic Atherosclerosis (ICD10-I70.0). Electronically Signed   By: Bretta BangWilliam  Woodruff III M.D.   On: 09/30/2017 14:55    Procedures Procedures (including critical care time)  Medications Ordered in ED Medications  nicotine (NICODERM CQ - dosed in mg/24 hours) patch 14 mg (14 mg Transdermal Not Given 10/01/17 0314)  ondansetron (ZOFRAN) tablet 4 mg (not administered)  alum & mag hydroxide-simeth (MAALOX/MYLANTA) 200-200-20 MG/5ML suspension 30 mL (not administered)  acetaminophen (TYLENOL) tablet 650 mg (650 mg Oral Given 10/01/17 0253)  ferrous sulfate tablet 325 mg (not administered)  levothyroxine (SYNTHROID, LEVOTHROID) tablet 100 mcg (not administered)  risperiDONE (RISPERDAL) tablet 1 mg (1 mg Oral Given 10/01/17 0253)  vitamin C (ASCORBIC ACID) tablet 250 mg (not administered)  vitamin E capsule 400 Units (not administered)     Initial Impression / Assessment and Plan / ED Course  I have reviewed the triage vital signs  and the nursing notes.  Pertinent labs & imaging results that were available during my care of the patient were reviewed by me and considered in my medical decision making (see chart for details).  Suicidal thoughts in patient with history of paranoid schizophrenia.  Old records are reviewed, and she does have prior ED visits for suicidal thoughts.  There is a significant disconnect between her claim of suicidal thoughts and her outward behavior.  I wonder if she is seeking some secondary gain.  Screening labs are ordered and TTS consultation is requested.  Final Clinical Impressions(s) / ED Diagnoses   Final diagnoses:  Suicidal ideation    ED Discharge Orders    None       Dione BoozeGlick, Aidah Forquer, MD 10/01/17 [phone]

## 2017-10-01 NOTE — ED Triage Notes (Signed)
Patient states she wants to kill herself. Patient states she wants to take all of her Risperdal and kill herself. Patient states she has a hx of schizophrenia.

## 2017-10-11 ENCOUNTER — Emergency Department (HOSPITAL_COMMUNITY)
Admission: EM | Admit: 2017-10-11 | Discharge: 2017-10-12 | Disposition: A | Discharge status: Home / Self Care | Payer: Self-pay | Attending: Emergency Medicine | Admitting: Emergency Medicine

## 2017-10-11 ENCOUNTER — Encounter (HOSPITAL_COMMUNITY): Payer: Self-pay | Admitting: Emergency Medicine

## 2017-10-11 ENCOUNTER — Other Ambulatory Visit: Payer: Self-pay

## 2017-10-11 DIAGNOSIS — Z79899 Other long term (current) drug therapy: Secondary | ICD-10-CM | POA: Insufficient documentation

## 2017-10-11 DIAGNOSIS — F17210 Nicotine dependence, cigarettes, uncomplicated: Secondary | ICD-10-CM | POA: Insufficient documentation

## 2017-10-11 DIAGNOSIS — F200 Paranoid schizophrenia: Secondary | ICD-10-CM | POA: Insufficient documentation

## 2017-10-11 DIAGNOSIS — R21 Rash and other nonspecific skin eruption: Secondary | ICD-10-CM | POA: Insufficient documentation

## 2017-10-11 DIAGNOSIS — F22 Delusional disorders: Secondary | ICD-10-CM

## 2017-10-11 LAB — CBC
HCT: 43.4 % (ref 36.0–46.0)
HEMOGLOBIN: 14.7 g/dL (ref 12.0–15.0)
MCH: 33.7 pg (ref 26.0–34.0)
MCHC: 33.9 g/dL (ref 30.0–36.0)
MCV: 99.5 fL (ref 78.0–100.0)
Platelets: 268 10*3/uL (ref 150–400)
RBC: 4.36 MIL/uL (ref 3.87–5.11)
RDW: 13 % (ref 11.5–15.5)
WBC: 8 10*3/uL (ref 4.0–10.5)

## 2017-10-11 LAB — COMPREHENSIVE METABOLIC PANEL
ALT: 14 U/L (ref 14–54)
AST: 19 U/L (ref 15–41)
Albumin: 4.1 g/dL (ref 3.5–5.0)
Alkaline Phosphatase: 81 U/L (ref 38–126)
Anion gap: 7 (ref 5–15)
BUN: 12 mg/dL (ref 6–20)
CHLORIDE: 107 mmol/L (ref 101–111)
CO2: 24 mmol/L (ref 22–32)
CREATININE: 0.59 mg/dL (ref 0.44–1.00)
Calcium: 9.1 mg/dL (ref 8.9–10.3)
GFR calc Af Amer: >60 mL/min (ref >60)
Glucose, Bld: 98 mg/dL (ref 65–99)
Potassium: 3.8 mmol/L (ref 3.5–5.1)
Sodium: 138 mmol/L (ref 135–145)
Total Bilirubin: 0.9 mg/dL (ref 0.3–1.2)
Total Protein: 7.1 g/dL (ref 6.5–8.1)

## 2017-10-11 LAB — ACETAMINOPHEN LEVEL: Acetaminophen (Tylenol), Serum: <10 ug/mL — ABNORMAL LOW (ref 10–30)

## 2017-10-11 LAB — ETHANOL

## 2017-10-11 LAB — SALICYLATE LEVEL: Salicylate Lvl: <7 mg/dL (ref 2.8–30.0)

## 2017-10-11 MED ORDER — PERMETHRIN 5 % EX CREA
TOPICAL_CREAM | Freq: Once | CUTANEOUS | Status: AC
  Administered 2017-10-11: 23:00:00 via TOPICAL
  Filled 2017-10-11: qty 60

## 2017-10-11 MED ORDER — RISPERIDONE 1 MG PO TABS
1.0000 mg | ORAL_TABLET | Freq: Every day | ORAL | Status: DC
Start: 2017-10-11 — End: 2017-10-12
  Administered 2017-10-11: 1 mg via ORAL
  Filled 2017-10-11: qty 1

## 2017-10-11 MED ORDER — DIPHENHYDRAMINE HCL 25 MG PO CAPS
25.0000 mg | ORAL_CAPSULE | Freq: Once | ORAL | Status: AC
  Administered 2017-10-11: 25 mg via ORAL
  Filled 2017-10-11: qty 1

## 2017-10-11 MED ORDER — VITAMIN C 250 MG PO TABS
250.0000 mg | ORAL_TABLET | Freq: Every day | ORAL | Status: DC
  Administered 2017-10-12: 250 mg via ORAL
  Filled 2017-10-11: qty 1

## 2017-10-11 MED ORDER — LEVOTHYROXINE SODIUM 100 MCG PO TABS
100.0000 ug | ORAL_TABLET | Freq: Every day | ORAL | Status: DC
  Administered 2017-10-12: 100 ug via ORAL
  Filled 2017-10-11: qty 1

## 2017-10-11 MED ORDER — VITAMIN E 180 MG (400 UNIT) PO CAPS
400.0000 [IU] | ORAL_CAPSULE | Freq: Every day | ORAL | Status: DC
  Administered 2017-10-12: 400 [IU] via ORAL
  Filled 2017-10-11: qty 1

## 2017-10-11 MED ORDER — ZINC OXIDE 40 % EX OINT
TOPICAL_OINTMENT | Freq: Two times a day (BID) | CUTANEOUS | Status: DC
  Administered 2017-10-12: 10:00:00 via TOPICAL
  Filled 2017-10-11: qty 57

## 2017-10-11 MED ORDER — ACETAMINOPHEN 325 MG PO TABS: 650.0000 mg | ORAL_TABLET | ORAL | Status: DC | PRN

## 2017-10-11 MED ORDER — FAMOTIDINE 20 MG PO TABS
20.0000 mg | ORAL_TABLET | Freq: Once | ORAL | Status: AC
  Administered 2017-10-11: 20 mg via ORAL
  Filled 2017-10-11: qty 1

## 2017-10-11 NOTE — ED Notes (Signed)
PA notified about patient's excoriated skin condition and rash.  PA wants to move patient back to TCU because of the possibility of scabies.  Charge nurse notified by PA.

## 2017-10-11 NOTE — ED Notes (Signed)
Report given to [facility]  
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
## 2017-10-11 NOTE — ED Notes (Signed)
Pt stated "I want to burn myself.  I stay over on [address] but [facility]
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
## 2017-10-11 NOTE — ED Triage Notes (Addendum)
Pt verbalizes "I am here with paranoid schizophrenia." Pt verbalizes hearing voices that she thinks are aliens. She continues verbalizes "they are trying to hurt me." Pt denies SI/HI. Pt poor hygiene and smells of urine.

## 2017-10-11 NOTE — ED Provider Notes (Signed)
Nurse notified me that when patient moved back to the TCU area she was placed in scrubs.  They noticed a rash to her bottom and to her inner thighs.  Patient states the rash is pruritic in nature.  Went to examine patient.  She does have what appears to be contact dermatitis to her inner thighs likely from irritation from pants soiled with urine.  She does have some excoriated bug bites that are scabbed and erythematous.  These are to her bilateral buttocks and extend to her inner thighs.  Does not seem consistent with candidal infection.  Patient has poor hygiene noted.  Patient is homeless and lives outside.  Question scabies however this is very low my possibilities.  There are no lesions or excoriations noted between the webs of her fingers.  However will treat with 1 dose of permethrin cream in the ED.  Have given her Desitin to help with irritation between her legs.  Offered Benadryl and Pepcid for itching.  Patient can follow-up with primary care in the outpatient setting or dermatology if symptoms persist.   Rise MuLeaphart, Kenneth T, PA-C 10/11/17 1846    Lorre NickAllen, Anthony, MD 10/11/17 2337

## 2017-10-12 DIAGNOSIS — F200 Paranoid schizophrenia: Secondary | ICD-10-CM

## 2017-10-12 DIAGNOSIS — F17210 Nicotine dependence, cigarettes, uncomplicated: Secondary | ICD-10-CM

## 2017-10-12 LAB — RAPID URINE DRUG SCREEN, HOSP PERFORMED
AMPHETAMINES: NOT DETECTED
BARBITURATES: NOT DETECTED
Benzodiazepines: NOT DETECTED
Cocaine: NOT DETECTED
OPIATES: NOT DETECTED
TETRAHYDROCANNABINOL: NOT DETECTED

## 2017-10-12 NOTE — Discharge Instructions (Signed)
For your mental health needs, you are advised to follow up with [facility].  New and returning patients are seen at their walk-in clinic.  Walk-in hours are Monday - Friday from 8:00 am - 3:00 pm.  Walk-in patients are seen on a first come, first served basis.  Try to arrive as early as possible for he best chance of being seen the same day: ° °     [facility] °     [address] °     [place], [place] [address] °     [phone] °

## 2017-10-12 NOTE — Consult Note (Addendum)
[place] Psychiatry Consult   Reason for Consult:  Psychosis  Referring Physician:  EDP Patient Identification: Dawn Fletcher MRN:  [id number] Principal Diagnosis: Schizophrenia, paranoid [facility]) Diagnosis:   Patient Active Problem List   Diagnosis Date Noted  . Schizophrenia, paranoid ([place]) [F20.0] 10/01/2017    Priority: High    Total Time spent with patient: 45 minutes  Subjective:   Dawn Fletcher is a 61 y.o. female patient does not warrant admission.  HPI:  61 yo female who presented to the ED with psychosis and mania.  Today, she is calm and cooperative with no psychosis or mania.  She reports she slept and feels much better.  Reports compliance with her medications.  No suicidal/homicidal ideations, hallucinations, or substance abuse.  Stable for discharge. She was seen in the [facility] on 12/10 with a similar presentation and improved with overnight observation.   Past Psychiatric History: Schizophrenia  Risk to Self: Suicidal Ideation: No Suicidal Intent: No Is patient at risk for suicide?: No Suicidal Plan?: No Access to Means: No What has been your use of drugs/alcohol within the last 12 months?: None How many times?: 1 Other Self Harm Risks: n/a  Triggers for Past Attempts: Unpredictable Intentional Self Injurious Behavior: None Risk to Others: Homicidal Ideation: No Thoughts of Harm to Others: No Current Homicidal Intent: No Current Homicidal Plan: No Access to Homicidal Means: No Identified Victim: N/A  History of harm to others?: No Assessment of Violence: None Noted Violent Behavior Description: pt denies hx violence Does patient have access to weapons?: No Criminal Charges Pending?: No Does patient have a court date: No Prior Inpatient Therapy: Prior Inpatient Therapy: Yes Prior Therapy Dates: pt doesn't remember exact dates Prior Therapy Facilty/Provider(s): [facility] in [place], [place] in [place] Reason for Treatment: schizophrenia Prior  Outpatient Therapy: Prior Outpatient Therapy: Yes Prior Therapy Dates: doesn't remember dates Prior Therapy Facilty/Provider(s): unknown Reason for Treatment: schizophrenia Does patient have an ACCT team?: No Does patient have Intensive In-House Services?  : No Does patient have [facility] services? : No Does patient have P4CC services?: No  Past Medical History:  Past Medical History:  Diagnosis Date  . Paranoid schizophrenia ([place])   . Thyroid disease     Past Surgical History:  Procedure Laterality Date  . ABDOMINAL HYSTERECTOMY     Family History: No family history on file. Family Psychiatric  History: none Social History:  Social History   Substance and Sexual Activity  Alcohol Use No     Social History   Substance and Sexual Activity  Drug Use No    Social History   Socioeconomic History  . Marital status: Divorced    Spouse name: None  . Number of children: None  . Years of education: None  . Highest education level: None  Social Needs  . Financial resource strain: None  . Food insecurity - worry: None  . Food insecurity - inability: None  . Transportation needs - medical: None  . Transportation needs - non-medical: None  Occupational History  . None  Tobacco Use  . Smoking status: Current Every Day Smoker    Packs/day: 0.50    Types: Cigarettes  . Smokeless tobacco: Never Used  Substance and Sexual Activity  . Alcohol use: No  . Drug use: No  . Sexual activity: None  Other Topics Concern  . None  Social History Narrative   ** Merged History Encounter **       Additional Social History: N/A  Allergies:  No Known Allergies  Labs:  Results for orders placed or performed during the hospital encounter of 10/11/17 (from the past 48 hour(s))  Comprehensive metabolic panel     Status: None   Collection Time: 10/11/17  2:18 PM  Result Value Ref Range   Sodium 138 135 - 145 mmol/L   Potassium 3.8 3.5 - 5.1 mmol/L   Chloride 107 101 - 111 mmol/L    CO2 24 22 - 32 mmol/L   Glucose, Bld 98 65 - 99 mg/dL   BUN 12 6 - 20 mg/dL   Creatinine, Ser 0.59 0.44 - 1.00 mg/dL   Calcium 9.1 8.9 - 10.3 mg/dL   Total Protein 7.1 6.5 - 8.1 g/dL   Albumin 4.1 3.5 - 5.0 g/dL   AST 19 15 - 41 U/L   ALT 14 14 - 54 U/L   Alkaline Phosphatase 81 38 - 126 U/L   Total Bilirubin 0.9 0.3 - 1.2 mg/dL   GFR calc non Af Amer >60 >60 mL/min   GFR calc Af Amer >60 >60 mL/min    Comment: (NOTE) The eGFR has been calculated using the CKD EPI equation. This calculation has not been validated in all clinical situations. eGFR's persistently <60 mL/min signify possible Chronic Kidney Disease.    Anion gap 7 5 - 15  Ethanol     Status: None   Collection Time: 10/11/17  2:18 PM  Result Value Ref Range   Alcohol, Ethyl (B) <10 <10 mg/dL    Comment:        LOWEST DETECTABLE LIMIT FOR SERUM ALCOHOL IS 10 mg/dL FOR MEDICAL PURPOSES ONLY   Salicylate level     Status: None   Collection Time: 10/11/17  2:18 PM  Result Value Ref Range   Salicylate Lvl <4.3 2.8 - 30.0 mg/dL  Acetaminophen level     Status: Abnormal   Collection Time: 10/11/17  2:18 PM  Result Value Ref Range   Acetaminophen (Tylenol), Serum <10 (L) 10 - 30 ug/mL    Comment:        THERAPEUTIC CONCENTRATIONS VARY SIGNIFICANTLY. A RANGE OF 10-30 ug/mL MAY BE AN EFFECTIVE CONCENTRATION FOR MANY PATIENTS. HOWEVER, SOME ARE BEST TREATED AT CONCENTRATIONS OUTSIDE THIS RANGE. ACETAMINOPHEN CONCENTRATIONS >150 ug/mL AT 4 HOURS AFTER INGESTION AND >50 ug/mL AT 12 HOURS AFTER INGESTION ARE OFTEN ASSOCIATED WITH TOXIC REACTIONS.   cbc     Status: None   Collection Time: 10/11/17  2:18 PM  Result Value Ref Range   WBC 8.0 4.0 - 10.5 K/uL   RBC 4.36 3.87 - 5.11 MIL/uL   Hemoglobin 14.7 12.0 - 15.0 g/dL   HCT 43.4 36.0 - 46.0 %   MCV 99.5 78.0 - 100.0 fL   MCH 33.7 26.0 - 34.0 pg   MCHC 33.9 30.0 - 36.0 g/dL   RDW 13.0 11.5 - 15.5 %   Platelets 268 150 - 400 K/uL  Rapid urine drug screen  (hospital performed)     Status: None   Collection Time: 10/12/17 12:30 AM  Result Value Ref Range   Opiates NONE DETECTED NONE DETECTED   Cocaine NONE DETECTED NONE DETECTED   Benzodiazepines NONE DETECTED NONE DETECTED   Amphetamines NONE DETECTED NONE DETECTED   Tetrahydrocannabinol NONE DETECTED NONE DETECTED   Barbiturates NONE DETECTED NONE DETECTED    Comment: (NOTE) DRUG SCREEN FOR MEDICAL PURPOSES ONLY.  IF CONFIRMATION IS NEEDED FOR ANY PURPOSE, NOTIFY LAB WITHIN 5 DAYS. LOWEST DETECTABLE LIMITS FOR URINE DRUG SCREEN  Drug Class                     Cutoff (ng/mL) Amphetamine and metabolites    1000 Barbiturate and metabolites    200 Benzodiazepine                 270 Tricyclics and metabolites     300 Opiates and metabolites        300 Cocaine and metabolites        300 THC                            50     Current Facility-Administered Medications  Medication Dose Route Frequency Provider Last Rate Last Dose  . acetaminophen (TYLENOL) tablet 650 mg  650 mg Oral Q4H PRN Ocie Cornfield T, PA-C      . levothyroxine (SYNTHROID, LEVOTHROID) tablet 100 mcg  100 mcg Oral QAC breakfast Leaphart, Kenneth T, PA-C      . liver oil-zinc oxide (DESITIN) 40 % ointment   Topical BID Ocie Cornfield T, PA-C      . risperiDONE (RISPERDAL) tablet 1 mg  1 mg Oral QHS Ocie Cornfield T, PA-C   1 mg at 10/11/17 2316  . vitamin C (ASCORBIC ACID) tablet 250 mg  250 mg Oral Daily Leaphart, Kenneth T, PA-C      . vitamin E capsule 400 Units  400 Units Oral Daily Leafy Half Zack Seal, PA-C       Current Outpatient Medications  Medication Sig Dispense Refill  . DM-Doxylamine-Acetaminophen (NYQUIL COLD & FLU PO) Take 30 mLs by mouth at bedtime as needed (cold/flu symptoms).    Marland Kitchen guaiFENesin (ROBITUSSIN) 100 MG/5ML liquid Take 5-10 mLs (100-200 mg total) by mouth every 4 (four) hours as needed for cough. 60 mL 0  . levothyroxine (SYNTHROID, LEVOTHROID) 100 MCG tablet Take 100 mcg by mouth  daily before breakfast.    . naproxen sodium (ANAPROX) 220 MG tablet Take 440 mg by mouth daily as needed (for pain).    . risperiDONE (RISPERDAL) 1 MG tablet Take 1 mg by mouth at bedtime.     . vitamin C (ASCORBIC ACID) 250 MG tablet Take 250 mg by mouth daily.    . vitamin E 400 UNIT capsule Take 400 Units by mouth daily.      Musculoskeletal: Strength & Muscle Tone: within normal limits Gait & Station: normal Patient leans: N/A  Psychiatric Specialty Exam: Physical Exam  Nursing note and vitals reviewed. Constitutional: She is oriented to person, place, and time. She appears well-developed and well-nourished.  HENT:  Head: Normocephalic.  Neck: Normal range of motion.  Respiratory: Effort normal.  Musculoskeletal: Normal range of motion.  Neurological: She is alert and oriented to person, place, and time.  Psychiatric: She has a normal mood and affect. Her speech is normal and behavior is normal. Judgment and thought content normal. Cognition and memory are normal.    Review of Systems  All other systems reviewed and are negative.   Blood pressure 131/62, pulse (!) 52, temperature 98 F (36.7 C), temperature source Oral, resp. rate 20, SpO2 95 %.There is no height or weight on file to calculate BMI.  General Appearance: Casual  Eye Contact:  Good  Speech:  Normal Rate  Volume:  Normal  Mood:  Euthymic  Affect:  Congruent  Thought Process:  Coherent and Descriptions of Associations: Intact  Orientation:  Full (Time, Place, and Person)  Thought Content:  WDL and Logical  Suicidal Thoughts:  No  Homicidal Thoughts:  No  Memory:  Immediate;   Good Recent;   Good Remote;   Good  Judgement:  Fair  Insight:  Fair  Psychomotor Activity:  Normal  Concentration:  Concentration: Good and Attention Span: Good  Recall:  Good  Fund of Knowledge:  Fair  Language:  Good  Akathisia:  No  Handed:  Right  AIMS (if indicated):   N/A  Assets:  Leisure Time Physical  Health Resilience  ADL's:  Intact  Cognition:  WNL  Sleep:   N/A     Treatment Plan Summary: Daily contact with patient to assess and evaluate symptoms and progress in treatment, Medication management and Plan schizophrenia, paranoid type:  -Crisis stabilization -Medication management:  Continued medical medications along with Risperdal 1 mg at bedtime for psychosis -Individual counseling  Disposition: No evidence of imminent risk to self or others at present.    Waylan Boga, NP 10/12/2017 9:48 AM   Patient seen face-to-face for psychiatric evaluation, chart reviewed and case discussed with the physician extender and developed treatment plan. Reviewed the information documented and agree with the treatment plan.  Buford Dresser, DO

## 2017-10-12 NOTE — BHH Suicide Risk Assessment (Signed)
Suicide Risk Assessment  Discharge Assessment   [facility]   Principal Problem: Schizophrenia, paranoid [facility](HCC) Discharge Diagnoses:  Patient Active Problem List   Diagnosis Date Noted  . Schizophrenia, paranoid (HCC) [F20.0] 10/01/2017    Priority: High    Total Time spent with patient: 45 minutes  Musculoskeletal: Strength & Muscle Tone: within normal limits Gait & Station: normal Patient leans: N/A  Psychiatric Specialty Exam: Physical Exam  Constitutional: She is oriented to person, place, and time. She appears well-developed and well-nourished.  HENT:  Head: Normocephalic.  Neck: Normal range of motion.  Respiratory: Effort normal.  Musculoskeletal: Normal range of motion.  Neurological: She is alert and oriented to person, place, and time.  Psychiatric: She has a normal mood and affect. Her speech is normal and behavior is normal. Judgment and thought content normal. Cognition and memory are normal.    Review of Systems  All other systems reviewed and are negative.   Blood pressure 131/62, pulse (!) 52, temperature 98 F (36.7 C), temperature source Oral, resp. rate 20, SpO2 95 %.There is no height or weight on file to calculate BMI.  General Appearance: Casual  Eye Contact:  Good  Speech:  Normal Rate  Volume:  Normal  Mood:  Euthymic  Affect:  Congruent  Thought Process:  Coherent and Descriptions of Associations: Intact  Orientation:  Full (Time, Place, and Person)  Thought Content:  WDL and Logical  Suicidal Thoughts:  No  Homicidal Thoughts:  No  Memory:  Immediate;   Good Recent;   Good Remote;   Good  Judgement:  Fair  Insight:  Fair  Psychomotor Activity:  Normal  Concentration:  Concentration: Good and Attention Span: Good  Recall:  Good  Fund of Knowledge:  Fair  Language:  Good  Akathisia:  No  Handed:  Right  AIMS (if indicated):     Assets:  Leisure Time Physical Health Resilience  ADL's:  Intact  Cognition:   WNL  Sleep:       Mental Status Per Nursing Assessment::   On Admission: psychosis    Demographic Factors:  Living alone  Loss Factors: NA  Historical Factors: NA  Risk Reduction Factors:   Sense of responsibility to family and Positive therapeutic relationship  Continued Clinical Symptoms:  NOne  Cognitive Features That Contribute To Risk:  None    Suicide Risk:  Minimal: No identifiable suicidal ideation.  Patients presenting with no risk factors but with morbid ruminations; may be classified as minimal risk based on the severity of the depressive symptoms    Plan Of Care/Follow-up recommendations:  Activity:  as tolerated Diet:  heart healthy diet  Dawn Akkerman, NP 10/12/2017, 10:40 AM

## 2017-10-12 NOTE — BH Assessment (Signed)
[facility]  Per Juanetta BeetsJacqueline Norman, DO, this pt does not require psychiatric hospitalization at this time.  [facility]  This has been included in pt's discharge instructions.  Pt's nurse, Donnal DebarRandi, has been notified.  Doylene Canninghomas Genoveva Singleton, MA Triage Specialist [phone]

## 2017-11-25 ENCOUNTER — Emergency Department (HOSPITAL_COMMUNITY): Admission: EM | Admit: 2017-11-25 | Discharge: 2017-11-25 | Discharge status: Against Medical Advice | Payer: Self-pay

## 2017-11-25 NOTE — ED Notes (Signed)
Pt states she thinks she is okay and would like to leave

## 2017-12-15 ENCOUNTER — Emergency Department (HOSPITAL_COMMUNITY)
Admission: EM | Admit: 2017-12-15 | Discharge: 2017-12-15 | Disposition: A | Discharge status: Home / Self Care | Payer: Self-pay | Attending: Physician Assistant | Admitting: Physician Assistant

## 2017-12-15 ENCOUNTER — Other Ambulatory Visit: Payer: Self-pay

## 2017-12-15 ENCOUNTER — Encounter (HOSPITAL_COMMUNITY): Payer: Self-pay | Admitting: *Deleted

## 2017-12-15 ENCOUNTER — Emergency Department (HOSPITAL_COMMUNITY): Payer: Self-pay

## 2017-12-15 DIAGNOSIS — Z79899 Other long term (current) drug therapy: Secondary | ICD-10-CM | POA: Insufficient documentation

## 2017-12-15 DIAGNOSIS — F17210 Nicotine dependence, cigarettes, uncomplicated: Secondary | ICD-10-CM | POA: Insufficient documentation

## 2017-12-15 DIAGNOSIS — R079 Chest pain, unspecified: Secondary | ICD-10-CM

## 2017-12-15 LAB — COMPREHENSIVE METABOLIC PANEL
ALT: 16 U/L (ref 14–54)
ANION GAP: 11 (ref 5–15)
AST: 21 U/L (ref 15–41)
Albumin: 4 g/dL (ref 3.5–5.0)
Alkaline Phosphatase: 87 U/L (ref 38–126)
BILIRUBIN TOTAL: 0.6 mg/dL (ref 0.3–1.2)
BUN: 14 mg/dL (ref 6–20)
CHLORIDE: 103 mmol/L (ref 101–111)
CO2: 23 mmol/L (ref 22–32)
Calcium: 8.6 mg/dL — ABNORMAL LOW (ref 8.9–10.3)
Creatinine, Ser: 0.63 mg/dL (ref 0.44–1.00)
Glucose, Bld: 99 mg/dL (ref 65–99)
POTASSIUM: 3.6 mmol/L (ref 3.5–5.1)
Sodium: 137 mmol/L (ref 135–145)
TOTAL PROTEIN: 7.1 g/dL (ref 6.5–8.1)

## 2017-12-15 LAB — CBC WITH DIFFERENTIAL/PLATELET
Basophils Absolute: 0 10*3/uL (ref 0.0–0.1)
Basophils Relative: 0 %
EOS ABS: 0.1 10*3/uL (ref 0.0–0.7)
EOS PCT: 1 %
HCT: 45 % (ref 36.0–46.0)
HEMOGLOBIN: 15.2 g/dL — AB (ref 12.0–15.0)
LYMPHS ABS: 3 10*3/uL (ref 0.7–4.0)
LYMPHS PCT: 33 %
MCH: 33.7 pg (ref 26.0–34.0)
MCHC: 33.8 g/dL (ref 30.0–36.0)
MCV: 99.8 fL (ref 78.0–100.0)
MONOS PCT: 5 %
Monocytes Absolute: 0.5 10*3/uL (ref 0.1–1.0)
Neutro Abs: 5.7 10*3/uL (ref 1.7–7.7)
Neutrophils Relative %: 61 %
PLATELETS: 257 10*3/uL (ref 150–400)
RBC: 4.51 MIL/uL (ref 3.87–5.11)
RDW: 13.1 % (ref 11.5–15.5)
WBC: 9.3 10*3/uL (ref 4.0–10.5)

## 2017-12-15 LAB — I-STAT TROPONIN, ED: Troponin i, poc: 0.01 ng/mL (ref 0.00–0.08)

## 2017-12-15 LAB — ACETAMINOPHEN LEVEL: Acetaminophen (Tylenol), Serum: <10 ug/mL — ABNORMAL LOW (ref 10–30)

## 2017-12-15 LAB — URINALYSIS, ROUTINE W REFLEX MICROSCOPIC
Bilirubin Urine: NEGATIVE
Glucose, UA: NEGATIVE mg/dL
Hgb urine dipstick: NEGATIVE
KETONES UR: NEGATIVE mg/dL
LEUKOCYTES UA: NEGATIVE
Nitrite: NEGATIVE
PROTEIN: NEGATIVE mg/dL
Specific Gravity, Urine: 1.013 (ref 1.005–1.030)
pH: 6 (ref 5.0–8.0)

## 2017-12-15 LAB — RAPID URINE DRUG SCREEN, HOSP PERFORMED
AMPHETAMINES: NOT DETECTED
BENZODIAZEPINES: NOT DETECTED
Barbiturates: NOT DETECTED
Cocaine: NOT DETECTED
Opiates: NOT DETECTED
Tetrahydrocannabinol: NOT DETECTED

## 2017-12-15 LAB — TROPONIN I

## 2017-12-15 LAB — SALICYLATE LEVEL

## 2017-12-15 LAB — ETHANOL

## 2017-12-15 NOTE — ED Notes (Signed)
Bed: [facility] Expected date:  Expected time:  Means of arrival:  Comments: 

## 2017-12-15 NOTE — ED Triage Notes (Signed)
Pt with strong urine odor.  Pt stated "I was on the couch and think I had an episode and wet myself.  I have angina."  Pt denies hearing voices @ this time or missing any BH meds.

## 2017-12-15 NOTE — ED Notes (Signed)
Pt's clothing all wet, smells of strong urine, double bagged in trash bags, labeled & placed behind RN desk.

## 2017-12-15 NOTE — Discharge Instructions (Signed)
Work up today is normal. Continue all home medications.   Return for chest pain that is worse with movement or exertion, cough, fevers, vomiting.

## 2017-12-15 NOTE — ED Provider Notes (Addendum)
[facility]-EMERGENCY DEPT Provider Note   CSN: [id number] Arrival date & time: 12/15/17  0604     History   Chief Complaint Chief Complaint  Patient presents with  . Medical Clearance    HPI Dawn Fletcher is a 62 y.o. female  With history of schizophrenia is here for "angina attack" that began while she was waking up this morning. Patient is a difficult historian, jumping from topic to topic and requires frequent redirection. States she's had angina attacks for the last 10 years that she gets treated at [facility] ED. Describes angina attack as stabbing and squeezing pain in the center of her chest. No interventions PTA. Is not associated with cough, shortness of breath, dizziness, fevers, nausea, sweating This is non pleuritic, not positional. She is not sure if exertion makes it worse. She is currently having discomfort. Also states her feet and legs are sore, the right side of her face feels cold, her head, jaw and temples feel numb. States she has been compliant with all her medications at home. She denies changes in mood, depressive or anxious thoughts, suicidal ideations, homicidal ideations or hallucinations. States she lives with a friend that is helping her out, this friend is out of town. Only has a cousin in the area. Stanton Kidney [phone].  HPI  Past Medical History:  Diagnosis Date  . Paranoid schizophrenia (HCC)   . Thyroid disease     Patient Active Problem List   Diagnosis Date Noted  . Schizophrenia, paranoid (HCC) 10/01/2017    Past Surgical History:  Procedure Laterality Date  . ABDOMINAL HYSTERECTOMY      OB History    No data available       Home Medications    Prior to Admission medications   Medication Sig Start Date End Date Taking? Authorizing Provider  DM-Doxylamine-Acetaminophen (NYQUIL COLD & FLU PO) Take 30 mLs by mouth at bedtime as needed (cold/flu symptoms).    [provider]  guaiFENesin (ROBITUSSIN) 100 MG/5ML  liquid Take 5-10 mLs (100-200 mg total) by mouth every 4 (four) hours as needed for cough. 08/02/17   Pricilla Loveless, MD  levothyroxine (SYNTHROID, LEVOTHROID) 100 MCG tablet Take 100 mcg by mouth daily before breakfast.    [provider]  naproxen sodium (ANAPROX) 220 MG tablet Take 440 mg by mouth daily as needed (for pain).    [provider]  risperiDONE (RISPERDAL) 1 MG tablet Take 1 mg by mouth at bedtime.     [provider]  vitamin C (ASCORBIC ACID) 250 MG tablet Take 250 mg by mouth daily.    [provider]  vitamin E 400 UNIT capsule Take 400 Units by mouth daily.    [provider]    Family History No family history on file.  Social History Social History   Tobacco Use  . Smoking status: Current Every Day Smoker    Packs/day: 0.50    Types: Cigarettes  . Smokeless tobacco: Never Used  Substance Use Topics  . Alcohol use: No  . Drug use: No     Allergies   Patient has no known allergies.   Review of Systems Review of Systems  Cardiovascular: Positive for chest pain.  All other systems reviewed and are negative.    Physical Exam Updated Vital Signs BP (!) 173/88 (BP Location: Left Arm)   Pulse 65   Temp 97.6 F (36.4 C)   Resp 18   Ht 5' 8.5" (1.74 m)   Wt  62.6 kg (138 lb)   SpO2 96%   BMI 20.68 kg/m   Physical Exam  Constitutional: She appears well-developed and well-nourished.  NAD. Non toxic.   HENT:  Head: Normocephalic and atraumatic.  Nose: Nose normal.  Moist mucous membranes. Tonsils and oropharynx normal  Eyes: Conjunctivae, EOM and lids are normal.  Neck: Trachea normal and normal range of motion.  Neck is supple Trachea midline No cervical adenopathy  Cardiovascular: Normal rate, regular rhythm, S1 normal, S2 normal and normal heart sounds.  Pulses:      Carotid pulses are 2+ on the right side, and 2+ on the left side.      Radial pulses are 2+ on the right side, and 2+ on the left  side.       Dorsalis pedis pulses are 2+ on the right side, and 2+ on the left side.  RRR. No orthopnea. No LE edema or calf tenderness.   Pulmonary/Chest: Effort normal and breath sounds normal. No respiratory distress. She has no decreased breath sounds. She has no rhonchi.  No reproducible chest wall tenderness. CP not reproducible with AROM of upper extremities. No rales or wheezing.  Abdominal: Soft. Bowel sounds are normal. There is no tenderness.  No epigastric tenderness. No distention.   Neurological: She is alert. GCS eye subscore is 4. GCS verbal subscore is 5. GCS motor subscore is 6.  Skin: Skin is warm and dry. Capillary refill takes less than 2 seconds.  No rash to chest wall  Psychiatric: Her speech is tangential. Cognition and memory are normal.  Inattentive and very tangential. Unable to stay in one topic, need constant redirection. Normal speech and affect otherwise. Denies SI, HI, AVH.  She is inattentive.     ED Treatments / Results  Labs (all labs ordered are listed, but only abnormal results are displayed) Labs Reviewed  COMPREHENSIVE METABOLIC PANEL - Abnormal; Notable for the following components:      Result Value   Calcium 8.6 (*)    All other components within normal limits  CBC WITH DIFFERENTIAL/PLATELET - Abnormal; Notable for the following components:   Hemoglobin 15.2 (*)    All other components within normal limits  ACETAMINOPHEN LEVEL - Abnormal; Notable for the following components:   Acetaminophen (Tylenol), Serum <10 (*)    All other components within normal limits  ETHANOL  RAPID URINE DRUG SCREEN, HOSP PERFORMED  SALICYLATE LEVEL  TROPONIN I  URINALYSIS, ROUTINE W REFLEX MICROSCOPIC  I-STAT TROPONIN, ED  I-STAT TROPONIN, ED    EKG  EKG Interpretation None       Radiology Dg Chest 2 View  Result Date: 12/15/2017 CLINICAL DATA:  Cough and chest pain. EXAM: CHEST  2 VIEW COMPARISON:  Chest x-ray dated September 30, 2017. FINDINGS: The  heart size and mediastinal contours are within normal limits. Both lungs are clear. Bilateral nipple shadows. The visualized skeletal structures are unremarkable. IMPRESSION: No active cardiopulmonary disease. Electronically Signed   By: Obie Dredge M.D.   On: 12/15/2017 09:31    Procedures Procedures (including critical care time)  Medications Ordered in ED Medications - No data to display   Initial Impression / Assessment and Plan / ED Course  I have reviewed the triage vital signs and the nursing notes.  Pertinent labs & imaging results that were available during my care of the patient were reviewed by me and considered in my medical decision making (see chart for details).    Pt is a 62 y.o. female  presents with "angina attack" onset when she woke up and at rest.  Symptoms have been constant.  Pertinent risk factors include age and tobacco abuse.  On exam VS are wnl. Pt is a difficulty history but this appears to be her baseline, she is tangential but able to answer questions appropriately when redirected. I attempted to call her cousin however she gave me the wrong phone number. She has h/o schizophrenia and today's tangential speech appears to be chronic per chart review. No SI, HI AVH. Cardiovascular and pulmonary exam benign. CXR, EKG, troponin x 2 within normal limits.  CBC and BMP unremarkable.  She has no SOB or pleuritic component to CP, no tachypnea, tachcycardia or other risk factors for PE. Heart score < 3. No infection in CXR or UA. Patient has ambulated and tolerated PO in ED. Given symptoms, reassuring ED work up, low risk HEART score patient will be discharged with recommendation to follow up with PCP and cardiologist in regards to today's hospital visit. ED return preacutions given. Pt appears reliable for follow up and is agreeable to discharge.    Final Clinical Impressions(s) / ED Diagnoses   Final diagnoses:  Central chest pain    ED Discharge Orders    None          Jerrell MylarGibbons, Cray Monnin J, PA-C 12/15/17 1130    Mackuen, Cindee Saltourteney Lyn, MD 12/15/17 1511

## 2018-06-13 IMAGING — CR DG CHEST 2V
2 series · 2 of 2 positions shown · non-contrast
Comparison: Chest x-ray dated September 30, 2017.

CLINICAL DATA: Cough and chest pain.

EXAM:
CHEST  2 VIEW

[w chest pa]
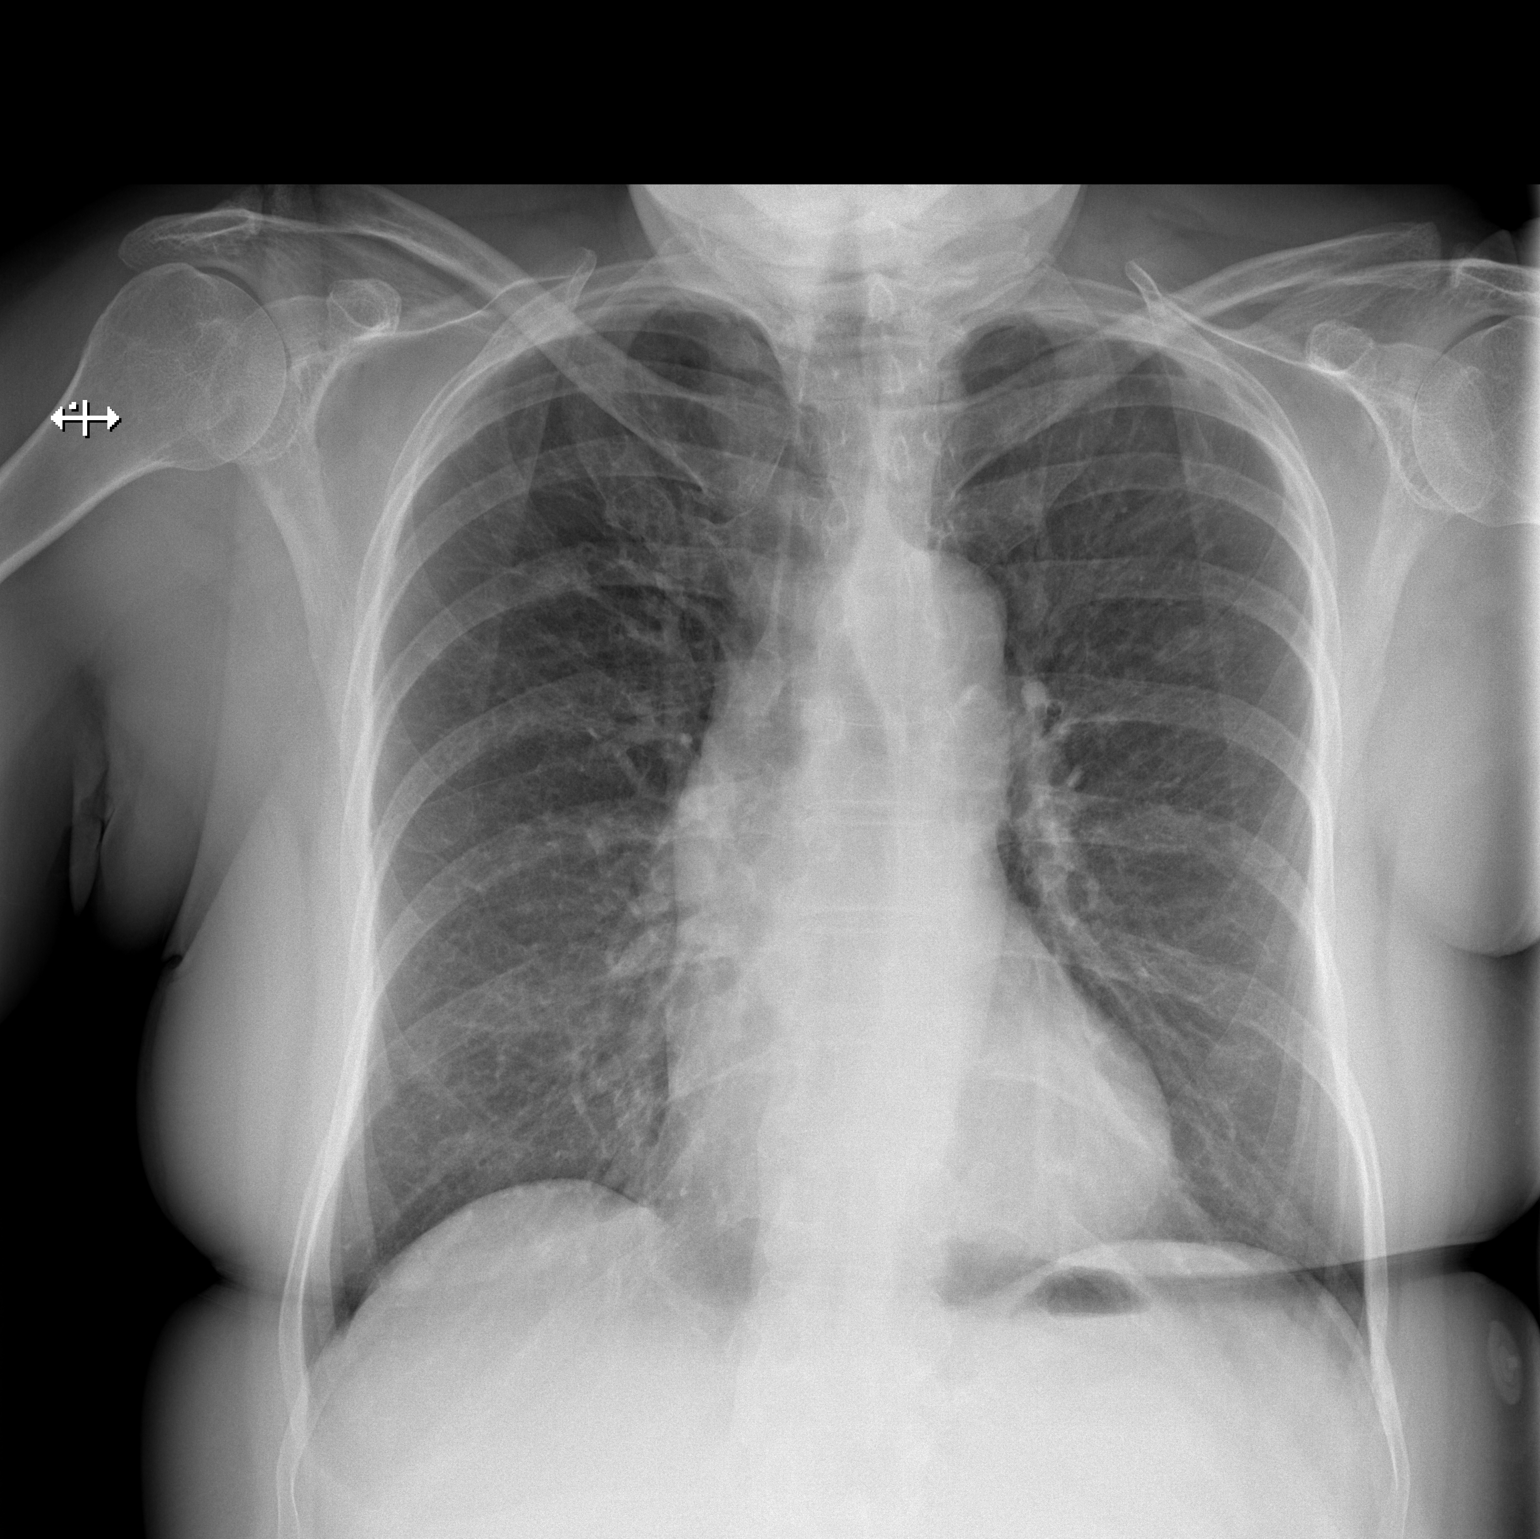

[w chest lat]
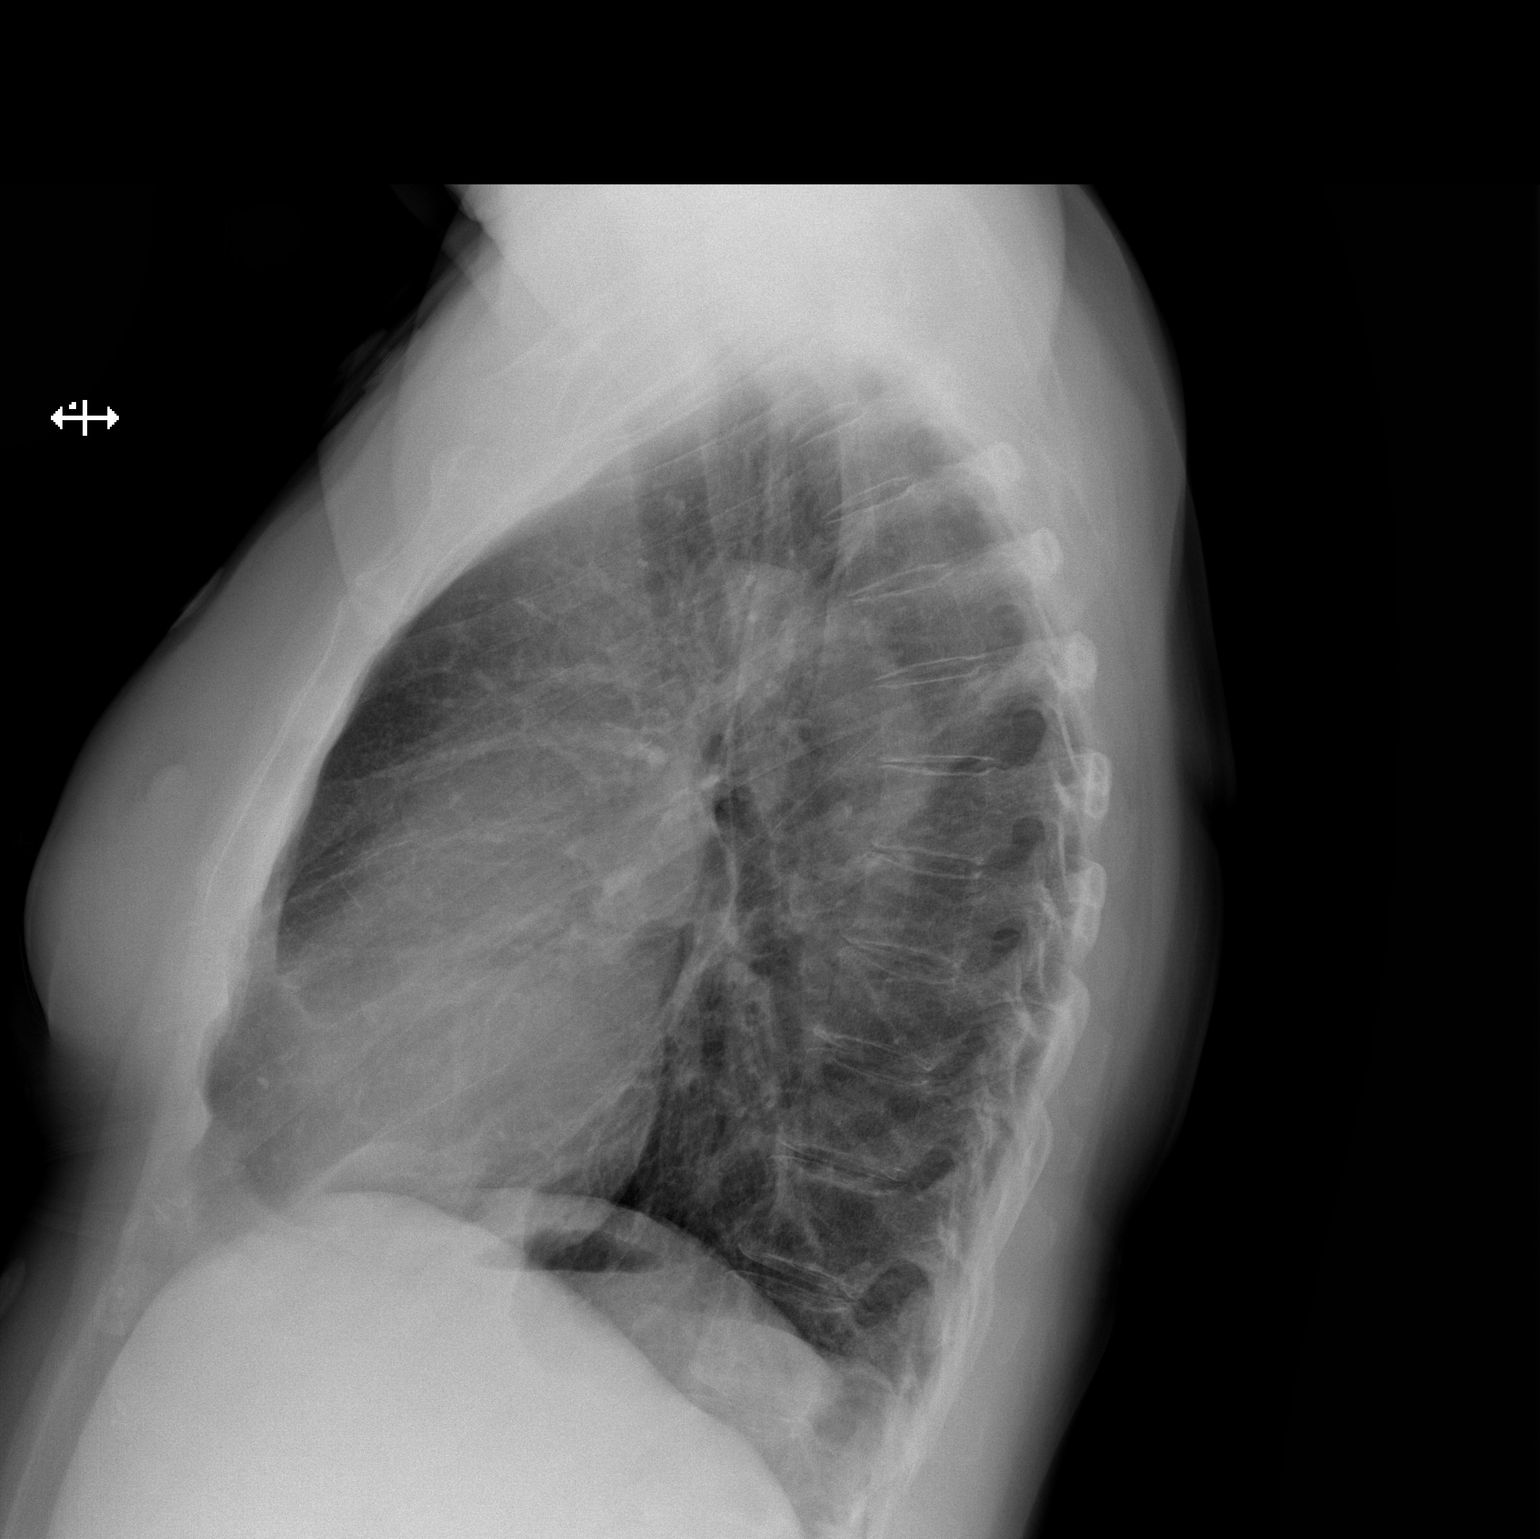

[2 of 2 positions shown; findings below may reference images not displayed]

FINDINGS: The heart size and mediastinal contours are within normal limits.
Both lungs are clear. Bilateral nipple shadows. The visualized
skeletal structures are unremarkable.
IMPRESSION: No active cardiopulmonary disease.

## 2024-06-10 ENCOUNTER — Encounter (HOSPITAL_COMMUNITY): Payer: Self-pay

## 2024-06-10 ENCOUNTER — Emergency Department (HOSPITAL_COMMUNITY)
Admission: EM | Admit: 2024-06-10 | Discharge: 2024-06-10 | Discharge status: Against Medical Advice | Payer: Self-pay | Attending: Emergency Medicine | Admitting: Emergency Medicine

## 2024-06-10 ENCOUNTER — Other Ambulatory Visit: Payer: Self-pay

## 2024-06-10 DIAGNOSIS — Z5321 Procedure and treatment not carried out due to patient leaving prior to being seen by health care provider: Secondary | ICD-10-CM | POA: Insufficient documentation

## 2024-06-10 DIAGNOSIS — R059 Cough, unspecified: Secondary | ICD-10-CM | POA: Insufficient documentation

## 2024-06-10 DIAGNOSIS — I209 Angina pectoris, unspecified: Secondary | ICD-10-CM | POA: Insufficient documentation

## 2024-06-10 NOTE — ED Triage Notes (Signed)
 Pt reports recently being sick but is no longer sick. She also reports angina but it not currently in pain.

## 2024-08-21 ENCOUNTER — Emergency Department (HOSPITAL_COMMUNITY)
Admission: EM | Admit: 2024-08-21 | Discharge: 2024-08-21 | Disposition: A | Discharge status: Home / Self Care | Payer: Self-pay | Source: Home / Self Care

## 2024-10-04 ENCOUNTER — Emergency Department (HOSPITAL_COMMUNITY)
Admission: EM | Admit: 2024-10-04 | Discharge: 2024-10-04 | Disposition: A | Discharge status: Home / Self Care | Payer: Self-pay | Attending: Emergency Medicine | Admitting: Emergency Medicine

## 2024-10-04 ENCOUNTER — Encounter (HOSPITAL_COMMUNITY): Payer: Self-pay

## 2024-10-04 ENCOUNTER — Other Ambulatory Visit: Payer: Self-pay

## 2024-10-04 DIAGNOSIS — M79671 Pain in right foot: Secondary | ICD-10-CM

## 2024-10-04 LAB — RESP PANEL BY RT-PCR (RSV, FLU A&B, COVID)  RVPGX2
Influenza A by PCR: NEGATIVE
Influenza B by PCR: NEGATIVE
Resp Syncytial Virus by PCR: NEGATIVE
SARS Coronavirus 2 by RT PCR: NEGATIVE

## 2024-10-04 MED ORDER — ACETAMINOPHEN 325 MG PO TABS
650.0000 mg | ORAL_TABLET | Freq: Once | ORAL | Status: AC
  Administered 2024-10-04: 650 mg via ORAL
  Filled 2024-10-04: qty 2

## 2024-10-04 NOTE — ED Triage Notes (Signed)
 Pt has multiple complaints that have been going on for a while. Pt states that she has pain all over, has eye issues, and ankle issues.

## 2024-10-04 NOTE — Discharge Instructions (Addendum)
 It was a pleasure taking care of you today.  Based on your history, physical exam, as well as labs I feel you are safe for discharge.  Today you tested negative for COVID, flu, as well as RSV.  Today while in the emergency department your eye exam was reassuring and your vision appears intact.  Please continue to monitor your symptoms.  As far as your foot pain is concerned, I recommend over-the-counter Tylenol , please do not exceed the max daily dose of 4000mg /day or more than 1000mg  in a single dose. Due to the amount you are walking it is possible you developed something called plantar fasciitis which is inflammation on the bottom of your foot. Please rest and use ice as well. If you experience any of the following symptoms including but not limited to severe pain, facial droop, slurred speech, vision changes, issues with balance or coordination, weakness, or other concerning symptom please return to the emergency department or seek further medical care.  Please make sure that you are keeping your feet clean and dry.  This established within follow-up with a primary care provider as soon as possible for a post-discharge visit, preferably within the next 1-2 weeks. If symptoms worsen recommend follow-up within 48 hours.

## 2024-10-04 NOTE — ED Provider Notes (Signed)
 [facility] EMERGENCY DEPARTMENT AT [facility] Provider Note   CSN: [id number] Arrival date & time: 10/04/24  9454     Patient presents with: Generalized Body Aches   Dawn Fletcher is a 68 y.o. female who presents to the emergency department with multiple complaints.  Patient states that over the course of last night she began to have weakness in both of her eyelids.  Patient states that her vision remained normal however states that she had some weakness and drooping of her left eyelid and then this switched to the right eyelid and then resolved. Patient denies facial droop otherwise, slurred speech, unilateral weakness, issues with balance or coordination, or issues with mentation. Patient denies history of stroke. States that she wanted to come and be evaluated for her eyelid symptoms as well as bilateral foot pain that has been going on for quite some time. Patient states that it is worse with movement. Patient states that she is able to walk and denies acute trauma/injury. Past medical history significant for paranoid schizophrenia, thyroid disease, etc. Per chart review it appears patient is well-known to the emergency department with over 20 visits in the past year.    HPI     Prior to Admission medications  Medication Sig Start Date End Date Taking? Authorizing Provider  DM-Doxylamine-Acetaminophen  (NYQUIL COLD & FLU PO) Take 30 mLs by mouth at bedtime as needed (cold/flu symptoms).    [provider]  guaiFENesin  (ROBITUSSIN) 100 MG/5ML liquid Take 5-10 mLs (100-200 mg total) by mouth every 4 (four) hours as needed for cough. 08/02/17   Freddi Hamilton, MD  levothyroxine  (SYNTHROID , LEVOTHROID) 100 MCG tablet Take 100 mcg by mouth daily before breakfast.    [provider]  naproxen sodium (ANAPROX) 220 MG tablet Take 440 mg by mouth daily as needed (for pain).    [provider]  risperiDONE  (RISPERDAL ) 1 MG tablet Take 1 mg by mouth at  bedtime.     [provider]  vitamin C  (ASCORBIC ACID ) 250 MG tablet Take 250 mg by mouth daily.    [provider]  vitamin E  400 UNIT capsule Take 400 Units by mouth daily.    [provider]    Allergies: Patient has no known allergies.    Review of Systems  Musculoskeletal:  Positive for arthralgias (bilateral foot pain).    Updated Vital Signs BP (!) 156/106 (BP Location: Right Wrist)   Pulse 61   Temp 97.8 F (36.6 C) (Oral)   Resp 16   SpO2 98%   Physical Exam Vitals and nursing note reviewed.  Constitutional:      General: She is awake. She is not in acute distress.    Appearance: She is ill-appearing (chronically ill-appearing). She is not toxic-appearing or diaphoretic.     Comments: Patient has disheveled appearance with poor personal hygiene  HENT:     Head: Normocephalic and atraumatic.  Eyes:     General: Lids are normal. Lids are everted, no foreign bodies appreciated. Vision grossly intact. Gaze aligned appropriately. No visual field deficit or scleral icterus.       Right eye: No foreign body or discharge.        Left eye: No foreign body or discharge.     Extraocular Movements: Extraocular movements intact.     Right eye: Normal extraocular motion and no nystagmus.     Left eye: Normal extraocular motion and no nystagmus.     Conjunctiva/sclera: Conjunctivae normal.  Pupils: Pupils are equal, round, and reactive to light.     Comments: Patient blinking appropriately, no sign of foreign body present, no sign of ptosis  Cardiovascular:     Rate and Rhythm: Normal rate and regular rhythm.  Pulmonary:     Effort: Pulmonary effort is normal. No respiratory distress.     Breath sounds: No wheezing, rhonchi or rales.  Musculoskeletal:        General: Normal range of motion.     Right lower leg: No edema.     Left lower leg: No edema.     Comments: Tenderness with palpation of plantar surface of bilateral lower extremities, normal  ROM of bilateral ankles and able to flex and extend digits appropriately, patient ambulatory without assistance  Skin:    General: Skin is warm.     Capillary Refill: Capillary refill takes less than 2 seconds.     Comments: No appreciated wounds or bruising of lower extremities, patient does have poor personally hygiene that is evident on examination with dirt and debris present on lower extremities  Neurological:     General: No focal deficit present.     Mental Status: She is alert and oriented to person, place, and time.     GCS: GCS eye subscore is 4. GCS verbal subscore is 5. GCS motor subscore is 6.     Cranial Nerves: Cranial nerves 2-12 are intact. No dysarthria or facial asymmetry.     Motor: Motor function is intact. No weakness.     Coordination: Coordination is intact.     Gait: Gait is intact.     Comments: Patient able to raise bilateral upper and lower extremities up against resistance as well as gravity with no strength deficit noted, grip strength equal bilaterally  Psychiatric:        Mood and Affect: Mood normal.        Behavior: Behavior normal. Behavior is cooperative.     Comments: Denies SI/HI, AVH     (all labs ordered are listed, but only abnormal results are displayed) Labs Reviewed  RESP PANEL BY RT-PCR (RSV, FLU A&B, COVID)  [id number]    EKG: None  Radiology: No results found.   Procedures   Medications Ordered in the ED  acetaminophen  (TYLENOL ) tablet 650 mg (650 mg Oral Given 10/04/24 1113)    Clinical Course as of 10/04/24 1641  Sat Oct 04, 2024  1015 Attempted to evaluate patient, patient in the restroom per staff [CH]    Clinical Course User Index [CH] Etoy Mcdonnell F, PA-C                                 Medical Decision Making Risk OTC drugs.   Patient presents to the ED for concern of bilateral eyelid drooping, foot pain, this involves an extensive number of treatment options, and is a complaint that carries with it a high risk  of complications and morbidity.  The differential diagnosis includes stroke, Bell's palsy, arthritis, plantar fasciitis, trauma/injury, ankle sprain/strain, etc.   Co morbidities that complicate the patient evaluation  paranoid schizophrenia, thyroid disease   Additional history obtained: Chart briefly reviewed including over 20 emergency department visits in the last year   Lab Tests:  I Ordered, and personally interpreted labs.  The pertinent results include: COVID, flu, RSV negative   Medicines ordered and prescription drug management:  I ordered medication including tylenol  for pain  Reevaluation of the patient after these medicines showed that the patient improved I have reviewed the patients home medicines and have made adjustments as needed   Test Considered:  Xrays of bilateral lower extremities, lab work: Deferred at this time as patient is ambulatory without assistance, denies acute trauma/injury, doubt lab work would change outcome of this visit, as patient vital signs are stable and she is afebrile, physical exam reassuring  CT imaging of head: deferred at this time as history and physical exam are reassuring, no focal neuro deficit, clinically doubt stroke or acute intracranial abnormality, patient denies headache or visual changes   Critical Interventions:  None   Problem List / ED Course:  68 year old female, vital signs stable, well-known to the emergency department, presents with multiple complaints, patient states that over the course of last night she had trouble sleeping and noticed that her eyelids were drooping, this started on the left and then transition to the right before it resolved, denies any strokelike symptoms or history of stroke, also states that she has chronic bilateral lower extremity pain that has been worsening On physical exam vision grossly intact, pupils equal round reactive to light, EOMs intact, no unilateral weakness, no facial droop,  mentation is appropriate, grossly normal range of motion of all 4 extremities and patient ambulatory however patient does have significant tenderness on plantar surfaces of bilateral feet Respiratory panel negative that was ordered from triage  Doubt further lab workup or imaging would change outcome of this visit, on reassessment after Tylenol  patient much improved, patient also requested food and drink while in the emergency department and feels improved after that as well, still no neurologic symptom, no drooping of eyelids, patient normal range of motion of bilateral lower extremities and is ambulatory At this time I feel patient stable for discharge Return precautions given Patient discharged Unknown cause of eyelid drooping at this time however reassuring history and physical examination, respiratory panel negative, doubt acute life threatening process, no sign of stroke   Reevaluation:  After the interventions noted above, I reevaluated the patient and found that they have :improved   Social Determinants of Health:  Homeless   Dispostion:  After consideration of the diagnostic results and the patients response to treatment, I feel that the patient would benefit from discharge and outpatient therapy as prescribed.      Final diagnoses:  Pain in both feet    ED Discharge Orders     None          Dawn Fletcher, [place] 10/04/24 1641    Franklyn Sid SAILOR, MD 10/06/24 2141

## 2024-10-09 ENCOUNTER — Other Ambulatory Visit: Payer: Self-pay

## 2024-10-09 ENCOUNTER — Encounter (HOSPITAL_COMMUNITY): Payer: Self-pay

## 2024-10-09 ENCOUNTER — Emergency Department (HOSPITAL_COMMUNITY)
Admission: EM | Admit: 2024-10-09 | Discharge: 2024-10-10 | Disposition: A | Payer: Self-pay | Attending: Emergency Medicine | Admitting: Emergency Medicine

## 2024-10-09 DIAGNOSIS — G8929 Other chronic pain: Secondary | ICD-10-CM | POA: Insufficient documentation

## 2024-10-09 DIAGNOSIS — M79662 Pain in left lower leg: Secondary | ICD-10-CM | POA: Insufficient documentation

## 2024-10-09 DIAGNOSIS — L03116 Cellulitis of left lower limb: Secondary | ICD-10-CM | POA: Insufficient documentation

## 2024-10-09 DIAGNOSIS — L03115 Cellulitis of right lower limb: Secondary | ICD-10-CM | POA: Insufficient documentation

## 2024-10-09 NOTE — ED Triage Notes (Signed)
 Presents with numerous areas of concern.   Appears as generalized pain with main area of concern to left lower leg.   Ambulatory.

## 2024-10-10 MED ORDER — ACETAMINOPHEN 500 MG PO TABS
1000.0000 mg | ORAL_TABLET | Freq: Once | ORAL | Status: AC
  Administered 2024-10-10: 1000 mg via ORAL
  Filled 2024-10-10: qty 2

## 2024-10-10 NOTE — ED Provider Notes (Signed)
 " [place] EMERGENCY DEPARTMENT AT [facility] Provider Note  CSN: [id number] Arrival date & time: 10/09/24 2156  Chief Complaint(s) Leg Pain  HPI Dawn Fletcher is a 68 y.o. female who is here today for multiple complaints.  States that she has generalized pain, had some pain in her left leg.  This been ongoing for some time.  Patient currently experiencing housing insecurity.   Past Medical History Past Medical History:  Diagnosis Date   Paranoid schizophrenia (HCC)    Thyroid disease    Patient Active Problem List   Diagnosis Date Noted   Schizophrenia, paranoid (HCC) 10/01/2017   Home Medication(s) Prior to Admission medications  Medication Sig Start Date End Date Taking? Authorizing Provider  DM-Doxylamine-Acetaminophen  (NYQUIL COLD & FLU PO) Take 30 mLs by mouth at bedtime as needed (cold/flu symptoms).    [provider]  guaiFENesin  (ROBITUSSIN) 100 MG/5ML liquid Take 5-10 mLs (100-200 mg total) by mouth every 4 (four) hours as needed for cough. 08/02/17   Freddi Hamilton, MD  levothyroxine  (SYNTHROID , LEVOTHROID) 100 MCG tablet Take 100 mcg by mouth daily before breakfast.    [provider]  naproxen sodium (ANAPROX) 220 MG tablet Take 440 mg by mouth daily as needed (for pain).    [provider]  risperiDONE  (RISPERDAL ) 1 MG tablet Take 1 mg by mouth at bedtime.     [provider]  vitamin C  (ASCORBIC ACID ) 250 MG tablet Take 250 mg by mouth daily.    [provider]  vitamin E  400 UNIT capsule Take 400 Units by mouth daily.    [provider]                                                                                                                                    Past Surgical History Past Surgical History:  Procedure Laterality Date   ABDOMINAL HYSTERECTOMY     Family History History reviewed. No pertinent family history.  Social History Social History[1] Allergies Patient has no  known allergies.  Review of Systems Review of Systems  Physical Exam Vital Signs  I have reviewed the triage vital signs BP (!) 146/86 (BP Location: Right Arm)   Pulse 77   Temp 97.9 F (36.6 C) (Oral)   Resp 18   Ht 5' 8 (1.727 m)   Wt 68 kg   SpO2 95%   BMI 22.81 kg/m   Physical Exam Vitals and nursing note reviewed.  Constitutional:      Appearance: She is not toxic-appearing.  HENT:     Head: Normocephalic and atraumatic.  Eyes:     Pupils: Pupils are equal, round, and reactive to light.  Cardiovascular:     Rate and Rhythm: Normal rate.  Pulmonary:     Effort: Pulmonary effort is normal.  Abdominal:     General: Abdomen is flat.     Palpations: Abdomen is soft.  Skin:    General: Skin is warm and dry.     Comments: This signs of cellulitis in the bilateral lower extremities  Neurological:     General: No focal deficit present.     Mental Status: She is alert.     ED Results and Treatments Labs (all labs ordered are listed, but only abnormal results are displayed) Labs Reviewed - No data to display                                                                                                                        Radiology No results found.  Pertinent labs & imaging results that were available during my care of the patient were reviewed by me and considered in my medical decision making (see MDM for details).  Medications Ordered in ED Medications  acetaminophen  (TYLENOL ) tablet 1,000 mg (has no administration in time range)                                                                                                                                     Procedures Procedures  (including critical care time)  Medical Decision Making / ED Course   This patient presents to the ED for concern of leg pain, this involves an extensive number of treatment options, and is a complaint that carries with it a high risk of complications and morbidity.  The  differential diagnosis includes chronic leg pain, housing insecurity MDM: Patient's physical exam is overall very reassuring.  She has no signs of cellulitis, good pulses in her legs.  She has been ambulatory.  I do not believe she requires imaging.  It is currently very cold and rainy evening.  Patient currently living on the streets.  I do think that is contributing to the patient's presentation to the ED here today.  I provided her with some food here in the ED.  Will provide her with some Tylenol .   Additional history obtained:  -External records from outside source obtained and reviewed including: Chart review including previous notes, labs, imaging, consultation notes   Lab Tests: -I ordered, reviewed, and interpreted labs.   The pertinent results include:   Labs Reviewed - No data to display     Medicines ordered and prescription drug management: Meds ordered this encounter  Medications   acetaminophen  (TYLENOL ) tablet 1,000 mg    -I have reviewed the patients home medicines  and have made adjustments as needed   Cardiac Monitoring: The patient was maintained on a cardiac monitor.  I personally viewed and interpreted the cardiac monitored which showed an underlying rhythm of: Sinus rhythm  Social Determinants of Health:  Factors impacting patients care include: Lack of access to primary care   Reevaluation: After the interventions noted above, I reevaluated the patient and found that they have :improved  Co morbidities that complicate the patient evaluation  Past Medical History:  Diagnosis Date   Paranoid schizophrenia (HCC)    Thyroid disease       Dispostion: I considered admission for this patient, however she is appropriate for discharge.     Final Clinical Impression(s) / ED Diagnoses Final diagnoses:  Chronic pain of left lower extremity     @PCDICTATION @     [1]  Social History Tobacco Use   Smoking status: Every Day    Current packs/day:  0.50    Types: Cigarettes   Smokeless tobacco: Never  Substance Use Topics   Alcohol use: No   Drug use: No     Mannie Pac T, DO 10/10/24 0214  "

## 2024-10-10 NOTE — Discharge Instructions (Addendum)
 Please follow-up with your primary care doctor.  If you do not have a primary care doctor, I have included the telephone number for the [facility] health and wellness clinic.

## 2024-10-11 ENCOUNTER — Emergency Department (HOSPITAL_COMMUNITY)
Admission: EM | Admit: 2024-10-11 | Discharge: 2024-10-11 | Disposition: A | Discharge status: Home / Self Care | Payer: Self-pay | Attending: Emergency Medicine | Admitting: Emergency Medicine

## 2024-10-11 DIAGNOSIS — M79674 Pain in right toe(s): Secondary | ICD-10-CM | POA: Insufficient documentation

## 2024-10-11 DIAGNOSIS — Z5900 Homelessness unspecified: Secondary | ICD-10-CM | POA: Insufficient documentation

## 2024-10-11 DIAGNOSIS — R6889 Other general symptoms and signs: Secondary | ICD-10-CM

## 2024-10-11 DIAGNOSIS — R109 Unspecified abdominal pain: Secondary | ICD-10-CM | POA: Insufficient documentation

## 2024-10-11 DIAGNOSIS — Z5971 Insufficient health insurance coverage: Secondary | ICD-10-CM | POA: Insufficient documentation

## 2024-10-11 DIAGNOSIS — H9201 Otalgia, right ear: Secondary | ICD-10-CM | POA: Insufficient documentation

## 2024-10-11 DIAGNOSIS — H579 Unspecified disorder of eye and adnexa: Secondary | ICD-10-CM | POA: Insufficient documentation

## 2024-10-11 NOTE — ED Provider Notes (Signed)
 "  EMERGENCY DEPARTMENT AT [facility] Provider Note   CSN: [id number] Arrival date & time: 10/11/24  9243     Patient presents with: Abdominal Pain   Dawn Fletcher is a 68 y.o. female with history of schizophrenia, currently homeless presents with multiple complaints.  She states her right eye is not closing all the way.  Her right ear has been bothering her as well.  Reports associated tenderness.  She has some abdominal discomfort without any vomiting, diarrhea, urinary or vaginal symptoms.  Additionally complains of right toe pain without any injury or trauma.  She is requesting something to eat.     Abdominal Pain     Past Medical History:  Diagnosis Date   Paranoid schizophrenia (HCC)    Thyroid disease    Past Surgical History:  Procedure Laterality Date   ABDOMINAL HYSTERECTOMY       Prior to Admission medications  Medication Sig Start Date End Date Taking? Authorizing Provider  DM-Doxylamine-Acetaminophen  (NYQUIL COLD & FLU PO) Take 30 mLs by mouth at bedtime as needed (cold/flu symptoms).    [provider]  guaiFENesin  (ROBITUSSIN) 100 MG/5ML liquid Take 5-10 mLs (100-200 mg total) by mouth every 4 (four) hours as needed for cough. 08/02/17   Freddi Hamilton, MD  levothyroxine  (SYNTHROID , LEVOTHROID) 100 MCG tablet Take 100 mcg by mouth daily before breakfast.    [provider]  naproxen sodium (ANAPROX) 220 MG tablet Take 440 mg by mouth daily as needed (for pain).    [provider]  risperiDONE  (RISPERDAL ) 1 MG tablet Take 1 mg by mouth at bedtime.     [provider]  vitamin C  (ASCORBIC ACID ) 250 MG tablet Take 250 mg by mouth daily.    [provider]  vitamin E  400 UNIT capsule Take 400 Units by mouth daily.    [provider]    Allergies: Patient has no known allergies.    Review of Systems  Gastrointestinal:  Positive for abdominal pain.    Updated Vital Signs BP (!)  148/72 (BP Location: Right Arm)   Pulse (!) 57   Temp 97.9 F (36.6 C) (Oral)   Resp 18   SpO2 97%   Physical Exam Vitals and nursing note reviewed.  Constitutional:      General: She is not in acute distress.    Appearance: She is well-developed.  HENT:     Head: Normocephalic and atraumatic.  Eyes:     Conjunctiva/sclera: Conjunctivae normal.  Cardiovascular:     Rate and Rhythm: Normal rate and regular rhythm.     Heart sounds: No murmur heard. Pulmonary:     Effort: Pulmonary effort is normal. No respiratory distress.     Breath sounds: Normal breath sounds.  Abdominal:     Palpations: Abdomen is soft.     Tenderness: There is no abdominal tenderness.  Musculoskeletal:        General: No swelling.     Cervical back: Neck supple.     Comments: No focal tenderness, erythema warmth, tolerates full range of motion of all extremities  Skin:    General: Skin is warm and dry.     Capillary Refill: Capillary refill takes less than 2 seconds.  Neurological:     Mental Status: She is alert.  Psychiatric:        Mood and Affect: Mood normal.     (all labs ordered are listed, but only abnormal results are displayed) Labs Reviewed -  No data to display  EKG: None  Radiology: No results found.   Procedures   Medications Ordered in the ED - No data to display  Clinical Course as of 10/11/24 1200  Sat Oct 11, 2024  1143 Patient evaluated for multiple complaints.  Upon arrival she is hemodynamically stable and nontoxic-appearing her exam is entirely benign.  Upon chart review it appears that she has been seen here numerous times with identical presentations.  Do not feel that any repeat lab work or imaging is indicated today.  Tolerating PO. Will be discharged. [JT]    Clinical Course User Index [JT] Donnajean Lynwood DEL, PA-C                                 Medical Decision Making  This patient presents to the ED with chief complaint(s) of multiple complaints .  The  complaint involves an extensive differential diagnosis and also carries with it a high risk of complications and morbidity.   Pertinent past medical history as listed in HPI  The differential diagnosis includes  Based off exam and history do not suspect any acute abdominal pathology.  Her toe is without any erythema, warmth.  Tolerates full range of motion.  Do not suspect septic joint, gout, fracture.  HEENT exam is benign as well.  Do not suspect conjunctivitis, glaucoma, foreign body  Additional history obtained: Records reviewed Care Everywhere/External Records  Disposition:   Patient will be discharged home. The patient has been appropriately medically screened and/or stabilized in the ED. I have low suspicion for any other emergent medical condition which would require further screening, evaluation or treatment in the ED or require inpatient management. At time of discharge the patient is hemodynamically stable and in no acute distress. I have discussed work-up results and diagnosis with patient and answered all questions. Patient is agreeable with discharge plan. We discussed strict return precautions for returning to the emergency department and they verbalized understanding.     Social Determinants of Health:   Patients lack of insurance, unhoused, and impaired access to primary care  increases the complexity of managing their presentation  This note was dictated with voice recognition software.  Despite best efforts at proofreading, errors may have occurred which can change the documentation meaning.       Final diagnoses:  Multiple complaints    ED Discharge Orders     None          Donnajean Lynwood DEL, PA-C 10/11/24 1200  "

## 2024-10-11 NOTE — ED Triage Notes (Signed)
 Pt arrived with multiple complaints of abdominal pain on right side, 9/10, foot pain 8/10.   Also stated left eye issues.  Pt NAD, A&O x 4.  Hypotensive.

## 2024-10-11 NOTE — Discharge Instructions (Addendum)
 You may follow with your primary care doctor for further evaluation.  If you experience any new or worsening symptoms please return to the emergency room.

## 2024-10-15 ENCOUNTER — Emergency Department (HOSPITAL_COMMUNITY)
Admission: EM | Admit: 2024-10-15 | Discharge: 2024-10-15 | Disposition: A | Discharge status: Home / Self Care | Payer: Self-pay | Attending: Emergency Medicine | Admitting: Emergency Medicine

## 2024-10-15 ENCOUNTER — Emergency Department (HOSPITAL_COMMUNITY): Payer: Self-pay

## 2024-10-15 ENCOUNTER — Other Ambulatory Visit: Payer: Self-pay

## 2024-10-15 DIAGNOSIS — R079 Chest pain, unspecified: Secondary | ICD-10-CM | POA: Diagnosis present

## 2024-10-15 LAB — CBC
HCT: 41.6 % (ref 36.0–46.0)
Hemoglobin: 14.2 g/dL (ref 12.0–15.0)
MCH: 33.1 pg (ref 26.0–34.0)
MCHC: 34.1 g/dL (ref 30.0–36.0)
MCV: 97 fL (ref 80.0–100.0)
Platelets: 267 K/uL (ref 150–400)
RBC: 4.29 MIL/uL (ref 3.87–5.11)
RDW: 12.6 % (ref 11.5–15.5)
WBC: 7.9 K/uL (ref 4.0–10.5)
nRBC: 0 % (ref 0.0–0.2)

## 2024-10-15 LAB — BASIC METABOLIC PANEL WITH GFR
Anion gap: 11 (ref 5–15)
BUN: 15 mg/dL (ref 8–23)
CO2: 23 mmol/L (ref 22–32)
Calcium: 9 mg/dL (ref 8.9–10.3)
Chloride: 107 mmol/L (ref 98–111)
Creatinine, Ser: 0.68 mg/dL (ref 0.44–1.00)
GFR, Estimated: >60 mL/min
Glucose, Bld: 101 mg/dL — ABNORMAL HIGH (ref 70–99)
Potassium: 3.7 mmol/L (ref 3.5–5.1)
Sodium: 140 mmol/L (ref 135–145)

## 2024-10-15 LAB — TROPONIN T, HIGH SENSITIVITY
Troponin T High Sensitivity: <15 ng/L (ref 0–19)
Troponin T High Sensitivity: <15 ng/L (ref 0–19)

## 2024-10-15 LAB — MAGNESIUM: Magnesium: 2.1 mg/dL (ref 1.7–2.4)

## 2024-10-15 NOTE — ED Provider Triage Note (Signed)
 Emergency Medicine Provider Triage Evaluation Note  Dawn Fletcher , a 68 y.o. female  was evaluated in triage.  Pt complains of multiple complaints including chest pain and vomiting.  Requesting coffee or Coke to drink.  Review of Systems  Positive: As above Negative: As above  Physical Exam  BP (!) 155/95 (BP Location: Left Arm)   Pulse 93   Temp 98 F (36.7 C) (Oral)   Resp 16   Ht 5' 8 (1.727 m)   Wt 68 kg   SpO2 94%   BMI 22.81 kg/m  Gen:   Awake, no distress   Resp:  Normal effort  MSK:   Moves extremities without difficulty  Other:    Medical Decision Making  Medically screening exam initiated at 2:06 PM.  Appropriate orders placed.  Dawn Fletcher was informed that the remainder of the evaluation will be completed by another provider, this initial triage assessment does not replace that evaluation, and the importance of remaining in the ED until their evaluation is complete.     Hildegard Loge, PA-C 10/15/24 1406

## 2024-10-15 NOTE — Discharge Instructions (Signed)
 Return to the emergency department as needed.

## 2024-10-15 NOTE — ED Triage Notes (Addendum)
 Pt c/o ear pain/ HA/ chest pain but reports hx of begin angina, pt also reports she is schizophrenic, pt is pleasant. Pt reports s/s x today , RN asked pt to rate chest pain, she reports not at this moment.

## 2024-10-15 NOTE — ED Provider Notes (Signed)
 " [place] EMERGENCY DEPARTMENT AT [facility] Provider Note  CSN: [id number] Arrival date & time: 10/15/24 1322  Chief Complaint(s) Chest Pain, Otalgia, and Headache  HPI Dawn Fletcher is a 68 y.o. female here today for multiple symptoms.  Patient reports that she has had some chest pain, couple of episodes of vomiting.  Also endorses that her left ear hurts.  At triage, patient was requesting coffee or Coke to drink.  She states she is not having any pain in her chest, nausea or abdominal pain at this time.   Past Medical History Past Medical History:  Diagnosis Date   Paranoid schizophrenia (HCC)    Thyroid disease    Patient Active Problem List   Diagnosis Date Noted   Schizophrenia, paranoid (HCC) 10/01/2017   Home Medication(s) Prior to Admission medications  Medication Sig Start Date End Date Taking? Authorizing Provider  DM-Doxylamine-Acetaminophen  (NYQUIL COLD & FLU PO) Take 30 mLs by mouth at bedtime as needed (cold/flu symptoms).    [provider]  guaiFENesin  (ROBITUSSIN) 100 MG/5ML liquid Take 5-10 mLs (100-200 mg total) by mouth every 4 (four) hours as needed for cough. 08/02/17   Freddi Hamilton, MD  levothyroxine  (SYNTHROID , LEVOTHROID) 100 MCG tablet Take 100 mcg by mouth daily before breakfast.    [provider]  naproxen sodium (ANAPROX) 220 MG tablet Take 440 mg by mouth daily as needed (for pain).    [provider]  risperiDONE  (RISPERDAL ) 1 MG tablet Take 1 mg by mouth at bedtime.     [provider]  vitamin C  (ASCORBIC ACID ) 250 MG tablet Take 250 mg by mouth daily.    [provider]  vitamin E  400 UNIT capsule Take 400 Units by mouth daily.    [provider]                                                                                                                                    Past Surgical History Past Surgical History:  Procedure Laterality Date   ABDOMINAL HYSTERECTOMY      Family History No family history on file.  Social History Social History[1] Allergies Patient has no known allergies.  Review of Systems Review of Systems  Physical Exam Vital Signs  I have reviewed the triage vital signs BP (!) 145/53 (BP Location: Left Arm)   Pulse 68   Temp 97.8 F (36.6 C) (Oral)   Resp 17   Ht 5' 8 (1.727 m)   Wt 68 kg   SpO2 92%   BMI 22.81 kg/m   Physical Exam Vitals and nursing note reviewed.  Constitutional:      Appearance: She is not toxic-appearing.  HENT:     Head: Normocephalic.  Cardiovascular:     Rate and Rhythm: Normal rate.     Heart sounds: Normal heart sounds.  Pulmonary:     Effort: Pulmonary effort is normal.  Breath sounds: Normal breath sounds.  Chest:     Chest wall: No mass or tenderness.  Abdominal:     Palpations: Abdomen is soft.  Musculoskeletal:        General: Normal range of motion.  Skin:    General: Skin is warm and dry.  Neurological:     General: No focal deficit present.     Mental Status: She is alert.  Psychiatric:        Mood and Affect: Mood normal.        Behavior: Behavior normal.     ED Results and Treatments Labs (all labs ordered are listed, but only abnormal results are displayed) Labs Reviewed  BASIC METABOLIC PANEL WITH GFR - Abnormal; Notable for the following components:      Result Value   Glucose, Bld 101 (*)    All other components within normal limits  CBC  MAGNESIUM  TROPONIN T, HIGH SENSITIVITY  TROPONIN T, HIGH SENSITIVITY                                                                                                                          Radiology DG Chest 2 View Result Date: 10/15/2024 EXAM: 2 VIEW(S) XRAY OF THE CHEST 10/15/2024 02:30:31 PM COMPARISON: None available. CLINICAL HISTORY: chest pain pta FINDINGS: LUNGS AND PLEURA: No focal pulmonary opacity. No pleural effusion. No pneumothorax. HEART AND MEDIASTINUM: Aortic calcification. Mildly tortuous  aorta. BONES AND SOFT TISSUES: Multilevel thoracic osteophytosis. Decreased bony mineralization. IMPRESSION: 1. No acute cardiopulmonary abnormality. Electronically signed by: Rogelia Myers MD 10/15/2024 03:10 PM EST RP Workstation: [id number]    Pertinent labs & imaging results that were available during my care of the patient were reviewed by me and considered in my medical decision making (see MDM for details).  Medications Ordered in ED Medications - No data to display                                                                                                                                   Procedures Procedures  (including critical care time)  Medical Decision Making / ED Course   This patient presents to the ED for concern of multiple complaints, this involves an extensive number of treatment options, and is a complaint that carries with it a high risk of complications and morbidity.  The differential diagnosis includes ACS, housing insecurity, chronic chest pain, nausea.  MDM: On exam, patient is overall well-appearing.  Regarding her chest pain, will obtain EKG, obtain troponin testing.  She is not currently experiencing any pain in her chest.  She has no abdominal pain or tenderness.  Is currently requesting to eat.  Left external ear and tympanic membrane normal.  I do believe the patient's current housing insecurity is contributing to her ED visit today.  Will provide her with a medical workup, discharge if negative.  Regarding the patient's schizophrenia, behavior is normal, does not appear to be responding to internal stimuli, does not appear to be a danger to herself or to others.  Reassessment 6:20 PM-patient requesting discharge.  Labs reviewed.  Will discharge.   Additional history obtained:  -External records from outside source obtained and reviewed including: Chart review including previous notes, labs, imaging, consultation notes   Lab Tests: -I ordered,  reviewed, and interpreted labs.   The pertinent results include:   Labs Reviewed  BASIC METABOLIC PANEL WITH GFR - Abnormal; Notable for the following components:      Result Value   Glucose, Bld 101 (*)    All other components within normal limits  CBC  MAGNESIUM  TROPONIN T, HIGH SENSITIVITY  TROPONIN T, HIGH SENSITIVITY      EKG my independent review of the patient's EKG shows no ST segment depressions or elevations, no T wave inversions, no evidence of acute ischemia.  EKG Interpretation Date/Time:  Wednesday October 15 2024 13:48:56 EST Ventricular Rate:  92 PR Interval:  141 QRS Duration:  109 QT Interval:  353 QTC Calculation: 437 R Axis:   -47  Text Interpretation: Sinus rhythm Atrial premature complex Biatrial enlargement Left axis deviation Confirmed by Mannie Pac [phone]) on 10/15/2024 3:21:05 PM         Imaging Studies ordered: I ordered imaging studies including chest x-ray I independently visualized and interpreted imaging. I agree with the radiologist interpretation   Medicines ordered and prescription drug management: No orders of the defined types were placed in this encounter.   -I have reviewed the patients home medicines and have made adjustments as needed    Cardiac Monitoring: The patient was maintained on a cardiac monitor.  I personally viewed and interpreted the cardiac monitored which showed an underlying rhythm of: Normal sinus rhythm  Social Determinants of Health:  Factors impacting patients care include: Housing insecurity   Reevaluation: After the interventions noted above, I reevaluated the patient and found that they have :improved  Co morbidities that complicate the patient evaluation  Past Medical History:  Diagnosis Date   Paranoid schizophrenia (HCC)    Thyroid disease       Dispostion: I considered admission for this patient, however she is appropriate for discharge.     Final Clinical Impression(s) / ED  Diagnoses Final diagnoses:  Chest pain, unspecified type     @PCDICTATION @     [1]  Social History Tobacco Use   Smoking status: Every Day    Current packs/day: 0.50    Types: Cigarettes   Smokeless tobacco: Never  Substance Use Topics   Alcohol use: No   Drug use: No     Mannie Pac T, DO 10/15/24 1826  "

## 2024-10-15 NOTE — ED Notes (Signed)
 Pt given clothes form donation pile

## 2024-10-24 ENCOUNTER — Emergency Department (HOSPITAL_COMMUNITY)
Admission: EM | Admit: 2024-10-24 | Discharge: 2024-10-24 | Disposition: A | Discharge status: Home / Self Care | Payer: Self-pay

## 2024-10-24 DIAGNOSIS — H6120 Impacted cerumen, unspecified ear: Secondary | ICD-10-CM

## 2024-10-24 DIAGNOSIS — H6123 Impacted cerumen, bilateral: Secondary | ICD-10-CM | POA: Diagnosis not present

## 2024-10-24 DIAGNOSIS — J029 Acute pharyngitis, unspecified: Secondary | ICD-10-CM | POA: Insufficient documentation

## 2024-10-24 LAB — GROUP A STREP BY PCR: Group A Strep by PCR: NOT DETECTED

## 2024-10-24 MED ORDER — CARBAMIDE PEROXIDE 6.5 % OT SOLN: 5.0000 [drp] | Freq: Two times a day (BID) | OTIC | 0 refills | Status: AC

## 2024-10-24 NOTE — Discharge Instructions (Addendum)
 Your strep test was negative.  You can use the Debrox medication that we prescribed to you to help with the earwax buildup in your ears.  Please follow-up with your primary doctor.  Return for any new or worsening symptoms that are concerning to you.

## 2024-10-24 NOTE — ED Triage Notes (Signed)
 C/o ear pain, throat pain. Some abdominal pain. Started around 0400 this morning. Patient mentioning vomiting, air pockets, etc. Patient is a poor historian.

## 2024-10-24 NOTE — ED Provider Notes (Signed)
 " [place] EMERGENCY DEPARTMENT AT [facility] Provider Note   CSN: [id number] Arrival date & time: 10/24/24  0930     Patient presents with: Sore Throat   Dawn Fletcher is a 69 y.o. female.   69 year old female presenting emergency department complaining of bilateral ear discomfort as well as sore throat.  Reports symptoms started this morning.  No fevers or chills.  No difficulty swallowing.  No chest pain shortness of breath.   Sore Throat       Prior to Admission medications  Medication Sig Start Date End Date Taking? Authorizing Provider  carbamide peroxide (DEBROX) 6.5 % OTIC solution Place 5 drops into both ears 2 (two) times daily. 10/24/24  Yes Bently Wyss, Caron PARAS, DO  DM-Doxylamine-Acetaminophen  (NYQUIL COLD & FLU PO) Take 30 mLs by mouth at bedtime as needed (cold/flu symptoms).    [provider]  guaiFENesin  (ROBITUSSIN) 100 MG/5ML liquid Take 5-10 mLs (100-200 mg total) by mouth every 4 (four) hours as needed for cough. 08/02/17   Freddi Hamilton, MD  levothyroxine  (SYNTHROID , LEVOTHROID) 100 MCG tablet Take 100 mcg by mouth daily before breakfast.    [provider]  naproxen sodium (ANAPROX) 220 MG tablet Take 440 mg by mouth daily as needed (for pain).    [provider]  risperiDONE  (RISPERDAL ) 1 MG tablet Take 1 mg by mouth at bedtime.     [provider]  vitamin C  (ASCORBIC ACID ) 250 MG tablet Take 250 mg by mouth daily.    [provider]  vitamin E  400 UNIT capsule Take 400 Units by mouth daily.    [provider]    Allergies: Patient has no known allergies.    Review of Systems  Updated Vital Signs BP (!) 143/83 (BP Location: Right Arm)   Pulse 74   Temp 98.1 F (36.7 C) (Oral)   Resp 18   SpO2 96%   Physical Exam Vitals and nursing note reviewed.  HENT:     Head: Normocephalic and atraumatic.     Right Ear: Tympanic membrane normal.     Left Ear: Tympanic membrane normal.      Ears:     Comments: Bilateral cerumen and external auditory canal    Mouth/Throat:     Mouth: Mucous membranes are moist.     Pharynx: Posterior oropharyngeal erythema present.     Tonsils: No tonsillar exudate.  Cardiovascular:     Rate and Rhythm: Normal rate and regular rhythm.  Abdominal:     Palpations: Abdomen is soft.  Skin:    General: Skin is warm.     Capillary Refill: Capillary refill takes less than 2 seconds.  Neurological:     General: No focal deficit present.     (all labs ordered are listed, but only abnormal results are displayed) Labs Reviewed  GROUP A STREP BY PCR    EKG: None  Radiology: No results found.   Procedures   Medications Ordered in the ED - No data to display  Clinical Course as of 10/24/24 1206  Fri Oct 24, 2024  1203 Group A Strep by PCR: NOT DETECTED [TY]    Clinical Course User Index [TY] Neysa Caron PARAS, DO                                 Medical Decision Making 69 year old female frequents emergency department at [facility] with various low acuity type  complaints.  Presenting today with ear discomfort and sore throat.  Has some wax in her ears, but no overt otitis media or infectious process identified on exam.  Strep test was negative for sore throat.  Uvula is midline maintaining her secretions.  Low suspicion for Ludwig's, peritonsillar or retropharyngeal abscess.  Likely viral URI.  Will discharge with Debrox.  She is tolerating p.o. here in the emergency department.  Amount and/or Complexity of Data Reviewed Labs:  Decision-making details documented in ED Course.  Risk OTC drugs.      Final diagnoses:  Sore throat  Cerumen in auditory canal on examination    ED Discharge Orders          Ordered    carbamide peroxide (DEBROX) 6.5 % OTIC solution  2 times daily        10/24/24 1204               Neysa Caron PARAS, [place] 10/24/24 1206  "

## 2024-10-30 ENCOUNTER — Emergency Department (HOSPITAL_COMMUNITY)
Admission: EM | Admit: 2024-10-30 | Discharge: 2024-10-30 | Discharge status: Against Medical Advice | Payer: Self-pay | Attending: Emergency Medicine | Admitting: Emergency Medicine

## 2024-10-30 ENCOUNTER — Other Ambulatory Visit: Payer: Self-pay

## 2024-10-30 DIAGNOSIS — R519 Headache, unspecified: Secondary | ICD-10-CM | POA: Insufficient documentation

## 2024-10-30 DIAGNOSIS — M791 Myalgia, unspecified site: Secondary | ICD-10-CM | POA: Diagnosis not present

## 2024-10-30 DIAGNOSIS — Z5321 Procedure and treatment not carried out due to patient leaving prior to being seen by health care provider: Secondary | ICD-10-CM | POA: Insufficient documentation

## 2024-10-30 NOTE — ED Notes (Signed)
 Pt called repeatedly and not visualized in the lobby

## 2024-10-30 NOTE — ED Triage Notes (Signed)
 Pt has been having chronic headaches and generalized body aches that started 45 minutes ago.

## 2024-11-06 ENCOUNTER — Emergency Department (HOSPITAL_COMMUNITY)
Admission: EM | Admit: 2024-11-06 | Discharge: 2024-11-07 | Disposition: A | Discharge status: Home / Self Care | Payer: Self-pay | Attending: Emergency Medicine | Admitting: Emergency Medicine

## 2024-11-06 ENCOUNTER — Other Ambulatory Visit: Payer: Self-pay

## 2024-11-06 ENCOUNTER — Encounter (HOSPITAL_COMMUNITY): Payer: Self-pay

## 2024-11-06 DIAGNOSIS — N939 Abnormal uterine and vaginal bleeding, unspecified: Secondary | ICD-10-CM | POA: Diagnosis not present

## 2024-11-06 DIAGNOSIS — H9203 Otalgia, bilateral: Secondary | ICD-10-CM | POA: Diagnosis present

## 2024-11-06 DIAGNOSIS — H6123 Impacted cerumen, bilateral: Secondary | ICD-10-CM | POA: Diagnosis not present

## 2024-11-06 LAB — URINALYSIS, ROUTINE W REFLEX MICROSCOPIC
Bilirubin Urine: NEGATIVE
Glucose, UA: NEGATIVE mg/dL
Hgb urine dipstick: NEGATIVE
Ketones, ur: NEGATIVE mg/dL
Nitrite: NEGATIVE
Protein, ur: NEGATIVE mg/dL
Specific Gravity, Urine: 1.025 (ref 1.005–1.030)
pH: 5 (ref 5.0–8.0)

## 2024-11-06 MED ORDER — DOCUSATE SODIUM 50 MG/5ML PO LIQD
1.0000 mL | Freq: Once | ORAL | Status: AC
  Administered 2024-11-07: 10 mg via OTIC
  Filled 2024-11-06: qty 10

## 2024-11-06 NOTE — ED Triage Notes (Signed)
 Multiple areas of concern.   1) Clitoral contractions  2) Vaginal bleeding  3) Hip pain 4) Leg pain 5) Ear pain

## 2024-11-06 NOTE — ED Provider Notes (Signed)
 " [place] EMERGENCY DEPARTMENT AT [facility] Provider Note   CSN: [id number] Arrival date & time: 11/06/24  2207     Patient presents with: Vaginal Bleeding   Dawn Fletcher is a 69 y.o. female.   Patient to ED with multiple complaints including vaginal bleeding that started earlier today associated with cramping. No urinary symptoms. She reports bilateral ear pain and trouble hearing. She reports chronic knee pain. She reports diarrhea describing infrequent loose bowel movements for a long time. No SOB, cough, vomiting.  The history is provided by the patient. No language interpreter was used.  Vaginal Bleeding      Prior to Admission medications  Medication Sig Start Date End Date Taking? Authorizing Provider  carbamide peroxide (DEBROX) 6.5 % OTIC solution Place 5 drops into both ears 2 (two) times daily. 10/24/24   Neysa Caron PARAS, DO  DM-Doxylamine-Acetaminophen  (NYQUIL COLD & FLU PO) Take 30 mLs by mouth at bedtime as needed (cold/flu symptoms).    [provider]  guaiFENesin  (ROBITUSSIN) 100 MG/5ML liquid Take 5-10 mLs (100-200 mg total) by mouth every 4 (four) hours as needed for cough. 08/02/17   Freddi Hamilton, MD  levothyroxine  (SYNTHROID , LEVOTHROID) 100 MCG tablet Take 100 mcg by mouth daily before breakfast.    [provider]  naproxen sodium (ANAPROX) 220 MG tablet Take 440 mg by mouth daily as needed (for pain).    [provider]  risperiDONE  (RISPERDAL ) 1 MG tablet Take 1 mg by mouth at bedtime.     [provider]  vitamin C  (ASCORBIC ACID ) 250 MG tablet Take 250 mg by mouth daily.    [provider]  vitamin E  400 UNIT capsule Take 400 Units by mouth daily.    [provider]    Allergies: Patient has no known allergies.    Review of Systems  Genitourinary:  Positive for vaginal bleeding.    Updated Vital Signs BP (!) 150/116   Pulse 79   Temp 97.6 F (36.4 C) (Oral)   Resp 15    SpO2 94%   Physical Exam Vitals and nursing note reviewed.  Constitutional:      Appearance: Normal appearance.     Comments: Unkempt, poor hygiene, pleasant, cooperative.  HENT:     Head: Normocephalic and atraumatic.     Ears:     Comments: Bilateral cerumen impaction. No canal swelling. No pain with ear movement.     Mouth/Throat:     Mouth: Mucous membranes are moist.  Cardiovascular:     Rate and Rhythm: Normal rate.  Pulmonary:     Effort: Pulmonary effort is normal.  Genitourinary:    Comments: No blood in the vaginal vault. No swelling or discharge. External vagina without redness or swelling.  Musculoskeletal:        General: Normal range of motion.     Cervical back: Normal range of motion and neck supple.  Skin:    General: Skin is warm and dry.  Neurological:     Mental Status: She is alert.     (all labs ordered are listed, but only abnormal results are displayed) Labs Reviewed  URINALYSIS, ROUTINE W REFLEX MICROSCOPIC - Abnormal; Notable for the following components:      Result Value   APPearance HAZY (*)    Leukocytes,Ua SMALL (*)    Bacteria, UA FEW (*)    All other components within normal limits    EKG: None  Radiology: No results found.  Procedures   Medications Ordered in the ED  docusate (COLACE) 50 MG/5ML liquid 10 mg (10 mg Both EARS Given 11/07/24 0003)    Clinical Course as of 11/07/24 0015  Fri Nov 07, 2024  0011 Patient to ED with multiple complaints. Acute complaints of bilateral ear pain, vaginal bleeding were addressed. She has cerumen impaction in both ears - history of the same. Colace administered to help relieve. She reported vaginal bleeding but there is no evidence of bleeding on exam. No pelvic tenderness. UA obtained and is negative for hematuria and infection. VSS. She is well appearing. She is homeless. Offered food but patient declined. She is felt stable for discharge home.  [SU]    Clinical Course User Index [SU]  Odell Balls, PA-C                                 Medical Decision Making Amount and/or Complexity of Data Reviewed Labs: ordered.  Risk OTC drugs.        Final diagnoses:  Bilateral impacted cerumen    ED Discharge Orders     None          Odell Balls, PA-C 11/07/24 0015    Theadore Ozell HERO, MD 11/07/24 [phone]  "

## 2024-11-07 NOTE — Discharge Instructions (Signed)
 As we discussed, you have no sign of vaginal bleeding on exam. No findings to suggest infection. Please see your doctor if you experience further symptoms.   Your ear wax build up should be relieved with the medication provided tonight. Your doctor can help with further symptoms.
